# Patient Record
Sex: Female | Born: 1955 | Race: White | Hispanic: No | Marital: Married | Smoking: Never smoker
Health system: Southern US, Community
[De-identification: ages and names within clinical notes are randomized; demographics above are authoritative.]

## PROBLEM LIST (undated history)

## (undated) DIAGNOSIS — F329 Major depressive disorder, single episode, unspecified: Secondary | ICD-10-CM

## (undated) DIAGNOSIS — F32A Depression, unspecified: Secondary | ICD-10-CM

## (undated) DIAGNOSIS — M2550 Pain in unspecified joint: Secondary | ICD-10-CM

## (undated) DIAGNOSIS — I10 Essential (primary) hypertension: Secondary | ICD-10-CM

## (undated) DIAGNOSIS — M549 Dorsalgia, unspecified: Secondary | ICD-10-CM

## (undated) DIAGNOSIS — F410 Panic disorder [episodic paroxysmal anxiety] without agoraphobia: Secondary | ICD-10-CM

## (undated) DIAGNOSIS — E039 Hypothyroidism, unspecified: Secondary | ICD-10-CM

## (undated) HISTORY — DX: Dorsalgia, unspecified: M54.9

## (undated) HISTORY — PX: CHOLECYSTECTOMY: SHX55

## (undated) HISTORY — PX: BREAST CYST EXCISION: SHX579

## (undated) HISTORY — DX: Hypothyroidism, unspecified: E03.9

## (undated) HISTORY — PX: OTHER SURGICAL HISTORY: SHX169

## (undated) HISTORY — DX: Pain in unspecified joint: M25.50

---
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
## 1983-02-13 HISTORY — PX: BREAST BIOPSY: SHX20

## 2000-02-13 HISTORY — PX: BREAST BIOPSY: SHX20

## 2000-02-13 HISTORY — PX: BREAST EXCISIONAL BIOPSY: SUR124

## 2000-03-08 ENCOUNTER — Encounter: Payer: Self-pay | Admitting: Family Medicine

## 2000-03-08 ENCOUNTER — Encounter: Admission: RE | Admit: 2000-03-08 | Discharge: 2000-03-08 | Payer: Self-pay | Admitting: Family Medicine

## 2001-04-12 ENCOUNTER — Emergency Department (HOSPITAL_COMMUNITY): Admission: EM | Admit: 2001-04-12 | Discharge: 2001-04-12 | Payer: Self-pay | Admitting: Emergency Medicine

## 2001-04-23 ENCOUNTER — Encounter: Payer: Self-pay | Admitting: Family Medicine

## 2001-04-23 ENCOUNTER — Encounter: Admission: RE | Admit: 2001-04-23 | Discharge: 2001-04-23 | Payer: Self-pay | Admitting: Family Medicine

## 2001-05-12 ENCOUNTER — Encounter: Admission: RE | Admit: 2001-05-12 | Discharge: 2001-05-12 | Payer: Self-pay | Admitting: Family Medicine

## 2001-05-12 ENCOUNTER — Encounter: Payer: Self-pay | Admitting: Family Medicine

## 2001-05-23 ENCOUNTER — Encounter: Payer: Self-pay | Admitting: General Surgery

## 2001-05-23 ENCOUNTER — Observation Stay (HOSPITAL_COMMUNITY): Admission: RE | Admit: 2001-05-23 | Discharge: 2001-05-24 | Payer: Self-pay | Admitting: General Surgery

## 2001-05-23 ENCOUNTER — Encounter (INDEPENDENT_AMBULATORY_CARE_PROVIDER_SITE_OTHER): Payer: Self-pay | Admitting: Specialist

## 2002-04-27 ENCOUNTER — Encounter: Admission: RE | Admit: 2002-04-27 | Discharge: 2002-04-27 | Payer: Self-pay | Admitting: Family Medicine

## 2002-04-27 ENCOUNTER — Encounter: Payer: Self-pay | Admitting: Family Medicine

## 2003-02-24 ENCOUNTER — Encounter: Admission: RE | Admit: 2003-02-24 | Discharge: 2003-02-24 | Payer: Self-pay | Admitting: Family Medicine

## 2003-09-29 ENCOUNTER — Encounter: Admission: RE | Admit: 2003-09-29 | Discharge: 2003-09-29 | Payer: Self-pay | Admitting: Family Medicine

## 2004-01-10 ENCOUNTER — Other Ambulatory Visit: Admission: RE | Admit: 2004-01-10 | Discharge: 2004-01-10 | Payer: Self-pay | Admitting: Gynecology

## 2004-07-08 ENCOUNTER — Emergency Department (HOSPITAL_COMMUNITY): Admission: EM | Admit: 2004-07-08 | Discharge: 2004-07-08 | Payer: Self-pay | Admitting: Emergency Medicine

## 2004-07-10 ENCOUNTER — Emergency Department (HOSPITAL_COMMUNITY): Admission: EM | Admit: 2004-07-10 | Discharge: 2004-07-11 | Payer: Self-pay | Admitting: Emergency Medicine

## 2004-08-11 ENCOUNTER — Encounter: Admission: RE | Admit: 2004-08-11 | Discharge: 2004-08-11 | Payer: Self-pay | Admitting: Family Medicine

## 2004-08-22 ENCOUNTER — Encounter: Admission: RE | Admit: 2004-08-22 | Discharge: 2004-08-22 | Payer: Self-pay | Admitting: Family Medicine

## 2004-11-08 ENCOUNTER — Encounter: Admission: RE | Admit: 2004-11-08 | Discharge: 2004-11-08 | Payer: Self-pay | Admitting: Family Medicine

## 2005-03-27 ENCOUNTER — Ambulatory Visit: Payer: Self-pay | Admitting: Gastroenterology

## 2005-06-13 ENCOUNTER — Emergency Department (HOSPITAL_COMMUNITY): Admission: EM | Admit: 2005-06-13 | Discharge: 2005-06-13 | Payer: Self-pay | Admitting: Emergency Medicine

## 2005-12-06 ENCOUNTER — Encounter: Admission: RE | Admit: 2005-12-06 | Discharge: 2005-12-06 | Payer: Self-pay | Admitting: Family Medicine

## 2007-01-29 ENCOUNTER — Encounter: Admission: RE | Admit: 2007-01-29 | Discharge: 2007-01-29 | Payer: Self-pay | Admitting: Family Medicine

## 2007-02-11 ENCOUNTER — Encounter: Admission: RE | Admit: 2007-02-11 | Discharge: 2007-02-11 | Payer: Self-pay | Admitting: Family Medicine

## 2007-03-19 ENCOUNTER — Encounter: Admission: RE | Admit: 2007-03-19 | Discharge: 2007-03-19 | Payer: Self-pay | Admitting: Family Medicine

## 2008-01-30 ENCOUNTER — Encounter: Admission: RE | Admit: 2008-01-30 | Discharge: 2008-01-30 | Payer: Self-pay | Admitting: Family Medicine

## 2009-03-14 ENCOUNTER — Encounter: Admission: RE | Admit: 2009-03-14 | Discharge: 2009-03-14 | Payer: Self-pay | Admitting: Family Medicine

## 2010-03-05 ENCOUNTER — Encounter: Payer: Self-pay | Admitting: Family Medicine

## 2010-05-05 ENCOUNTER — Other Ambulatory Visit: Payer: Self-pay | Admitting: Family Medicine

## 2010-05-05 DIAGNOSIS — Z1231 Encounter for screening mammogram for malignant neoplasm of breast: Secondary | ICD-10-CM

## 2010-05-09 ENCOUNTER — Ambulatory Visit
Admission: RE | Admit: 2010-05-09 | Discharge: 2010-05-09 | Disposition: A | Discharge status: Home / Self Care | Payer: Self-pay | Source: Ambulatory Visit | Attending: Family Medicine | Admitting: Family Medicine

## 2010-05-09 DIAGNOSIS — Z1231 Encounter for screening mammogram for malignant neoplasm of breast: Secondary | ICD-10-CM

## 2010-06-30 NOTE — Op Note (Signed)
[facility]  Patient:    KURSTIN, DIMARZO Visit Number: [id number] MRN: [id number]          Service Type: DSU Location: DAY Attending Physician:  Carson Myrtle Dictated by:   Sheppard Plumber Earlene Plater, M.D. Proc. Date: 05/23/01 Admit Date:  05/23/2001   CC:         Tammy R. Collins [place], M.D.   Operative Report  PREOPERATIVE DIAGNOSIS:  Cholecystolithiasis.  POSTOPERATIVE DIAGNOSIS:  Cholecystolithiasis.  PROCEDURE:  Laparoscopic cholecystectomy with operative cholangiogram.  SURGEON:  Timothy E. Earlene Plater, M.D.  ASSISTANT:  Zigmund Daniel, M.D.  ANESTHESIA:  General.  CRNA supervised (Dr.Fortune).  INDICATIONS:  Ms. Carp is 42 and has had significant symptoms compatible with cholecystolithiasis.  Ultrasound confirmed stones.  Liver function studies showed elevation of SGOT and SGPT.  On a very limited diet she has been asymptomatic for the past seven days.  She is ready now to proceed with this urgently scheduled laparoscopic cholecystectomy.  DESCRIPTION OF PROCEDURE:  The patient was brought to the operating room and placed supine.  General endotracheal anesthesia was administered and the abdomen was prepped and draped in sterile fashion.  A vertical midline incision was made beneath the umbilicus and the fascia identified.  Scar tissue and old Tycron sutures identified.  The sutures were removed.  The fascia divided in the midline.  The peritoneum entered without complication.  A #1 Vicryl suture was placed across that wound, and a Hasson catheter passed between the suture and tied in place.  The abdomen was insufflated. Pelvic peritoneoscopy revealed cystic areas on the scarred right ovary, but otherwise normal.  Attention to the upper abdomen revealed a chronic and inflamed gallbladder.  Two 5 mm trocars were placed in the right upper quadrant, and a 10 mm trocar was placed in the mid epigastrium.  The gallbladder was grasped, placed on  tension, careful dissection at the base of the gallbladder revealed a normal-appearing cystic duct entering the gallbladder.  This was carefully dissected at the junction with the gallbladder.  A clip was placed on the gallbladder portion of the cystic duct.  A small incision was made in the cystic duct, and several small stones were removed. The Reddick catheter introduced, clamped and using real-time fluoroscopy dilute HiPaque flow freely through the cystic duct into the common duct system -- which filled rapidly and normally emptied into the duodenum. The clip was removed and the catheter removed.  The remnant of the cystic duct isolated, triply clipped, and the cystic duct divided.  Behind that was a normal appearing cystic duct artery, emanating from an hepatic artery.  The cystic duct portion only was quadruply clipped and divided.  The gallbladder was removed from the gallbladder bed, and with some difficulty because it was more intrahepatic than not.  Small rent made in the gallbladder, the bile and several small stones were suctioned.  The gallbladder was completely removed from the gallbladder bed without further difficulty.  It was put in and Endocatch bag.  The gallbladder bed was inspected and cauterized.  All the bile and several small stones were irrigated and suctioned away.  The gallbladder was removed through the infraumbilical incision, which was then tied snugly.  Further irrigation was clear.  No evidence of loose stones. With this, the procedure was complete.  All irrigants, CO2, instruments and trocars were removed.  The skin incision was closed with 3-0 Monocryl. Steri-Strips applied.  Dry sterile dressings.  Count was correct.  She tolerated it  well and was removed from the recovery room in good condition. Dictated by:   Sheppard Plumber Earlene Plater, M.D. Attending Physician:  Carson Myrtle DD:  05/23/01 TD:  05/24/01 Job: [phone]

## 2010-10-04 ENCOUNTER — Encounter (HOSPITAL_COMMUNITY): Payer: Self-pay | Admitting: *Deleted

## 2010-10-04 ENCOUNTER — Emergency Department (HOSPITAL_COMMUNITY): Payer: 59

## 2010-10-04 ENCOUNTER — Emergency Department (HOSPITAL_COMMUNITY)
Admission: EM | Admit: 2010-10-04 | Discharge: 2010-10-04 | Disposition: A | Discharge status: Home / Self Care | Payer: 59 | Attending: Emergency Medicine | Admitting: Emergency Medicine

## 2010-10-04 ENCOUNTER — Other Ambulatory Visit: Payer: Self-pay

## 2010-10-04 DIAGNOSIS — R42 Dizziness and giddiness: Secondary | ICD-10-CM | POA: Insufficient documentation

## 2010-10-04 DIAGNOSIS — F3289 Other specified depressive episodes: Secondary | ICD-10-CM | POA: Insufficient documentation

## 2010-10-04 DIAGNOSIS — I10 Essential (primary) hypertension: Secondary | ICD-10-CM | POA: Insufficient documentation

## 2010-10-04 DIAGNOSIS — R5381 Other malaise: Secondary | ICD-10-CM | POA: Insufficient documentation

## 2010-10-04 DIAGNOSIS — F329 Major depressive disorder, single episode, unspecified: Secondary | ICD-10-CM | POA: Insufficient documentation

## 2010-10-04 DIAGNOSIS — R5383 Other fatigue: Secondary | ICD-10-CM | POA: Insufficient documentation

## 2010-10-04 DIAGNOSIS — F410 Panic disorder [episodic paroxysmal anxiety] without agoraphobia: Secondary | ICD-10-CM

## 2010-10-04 HISTORY — DX: Panic disorder (episodic paroxysmal anxiety): F41.0

## 2010-10-04 HISTORY — DX: Major depressive disorder, single episode, unspecified: F32.9

## 2010-10-04 HISTORY — DX: Essential (primary) hypertension: I10

## 2010-10-04 HISTORY — DX: Depression, unspecified: F32.A

## 2010-10-04 LAB — CBC
MCH: 30.4 pg (ref 26.0–34.0)
MCHC: 34 g/dL (ref 30.0–36.0)
MCV: 89.4 fL (ref 78.0–100.0)
Platelets: 355 10*3/uL (ref 150–400)
RDW: 12.4 % (ref 11.5–15.5)

## 2010-10-04 LAB — URINALYSIS, ROUTINE W REFLEX MICROSCOPIC
Glucose, UA: NEGATIVE mg/dL
Ketones, ur: NEGATIVE mg/dL
Leukocytes, UA: NEGATIVE
Protein, ur: NEGATIVE mg/dL
Urobilinogen, UA: 0.2 mg/dL (ref 0.0–1.0)

## 2010-10-04 LAB — BASIC METABOLIC PANEL
Calcium: 10.1 mg/dL (ref 8.4–10.5)
Creatinine, Ser: 0.76 mg/dL (ref 0.50–1.10)
GFR calc non Af Amer: >60 mL/min (ref >60)
Sodium: 138 mEq/L (ref 135–145)

## 2010-10-04 NOTE — ED Notes (Signed)
Pt with hx of HTN and panic attacks; states approx 1230 today, while sitting at desk at work, felt like she was having a panic attack and felt "like my blood pressure shot straight to my head"; reports she took one and a half 0.25 mg Xanax (0.75mg ) at that time; also c/o feeling weak at onset, but that is better now; states feels drowsy from the Xanax. Denies pain; Alert, anxious, answers questions appropriately.  Placed on NIBP monitoring q 30 min.

## 2010-10-04 NOTE — ED Provider Notes (Signed)
History     CSN: [id number] Arrival date & time: 10/04/2010  1:38 PM  Chief Complaint  Patient presents with  . Hypertension   Patient is a 55 y.o. female presenting with hypertension. The history is provided by the patient.  Hypertension This is a recurrent problem. The current episode started today. The problem occurs intermittently. The problem has been gradually improving. Associated symptoms include fatigue and vertigo. Pertinent negatives include no abdominal pain, arthralgias, change in bowel habit, chest pain, coughing or neck pain. Associated symptoms comments: anxious. The symptoms are aggravated by nothing. She has tried walking (xanax) for the symptoms. The treatment provided mild relief.    Past Medical History  Diagnosis Date  . Hypertension   . Panic attacks   . Depression     Past Surgical History  Procedure Date  . Cesarean section   . Cholecystectomy     History reviewed. No pertinent family history.  History  Substance Use Topics  . Smoking status: Never Smoker   . Smokeless tobacco: Not on file  . Alcohol Use: No    OB History    Grav Para Term Preterm Abortions TAB SAB Ect Mult Living                  Review of Systems  Constitutional: Positive for fatigue. Negative for activity change.       All ROS Neg except as noted in HPI  HENT: Negative for nosebleeds and neck pain.   Eyes: Negative for photophobia and discharge.  Respiratory: Negative for cough, shortness of breath and wheezing.   Cardiovascular: Negative for chest pain and palpitations.  Gastrointestinal: Negative for abdominal pain, blood in stool and change in bowel habit.  Genitourinary: Negative for dysuria, frequency and hematuria.  Musculoskeletal: Negative for back pain and arthralgias.  Skin: Negative.   Neurological: Positive for vertigo. Negative for dizziness, seizures and speech difficulty.  Psychiatric/Behavioral: Negative for hallucinations and confusion. The patient is  nervous/anxious.     Physical Exam  BP 148/76  Pulse 86  Temp(Src) 98.3 F (36.8 C) (Oral)  Resp 16  Ht 5\' 1"  (1.549 m)  Wt 146 lb (66.225 kg)  BMI 27.59 kg/m2  SpO2 96%  Physical Exam  Nursing note and vitals reviewed. Constitutional: She is oriented to person, place, and time. She appears well-developed and well-nourished.  Non-toxic appearance.  HENT:  Head: Normocephalic.  Right Ear: Tympanic membrane and external ear normal.  Left Ear: Tympanic membrane and external ear normal.  Eyes: EOM and lids are normal. Pupils are equal, round, and reactive to light.  Neck: Normal range of motion. Neck supple. Carotid bruit is not present.  Cardiovascular: Normal rate, regular rhythm, normal heart sounds, intact distal pulses and normal pulses.   Pulmonary/Chest: Breath sounds normal. No respiratory distress.  Abdominal: Soft. Bowel sounds are normal. There is no tenderness. There is no guarding.  Musculoskeletal: Normal range of motion.  Lymphadenopathy:       Head (right side): No submandibular adenopathy present.       Head (left side): No submandibular adenopathy present.    She has no cervical adenopathy.  Neurological: She is alert and oriented to person, place, and time. She has normal strength. No cranial nerve deficit or sensory deficit. Coordination normal. GCS eye subscore is 4. GCS verbal subscore is 5. GCS motor subscore is 6.       Grip symetrical. No pronator drift. Speech clear.  Skin: Skin is warm and dry.  Psychiatric:  She has a normal mood and affect. Her speech is normal.    ED Course  Procedures  MDM I have reviewed nursing notes, vital signs, and all appropriate lab and imaging results for this patient. No gross neurologic changes. Will check chemistries and ekg. Pt getting better after xanax.      Kathie Dike, [place] 10/04/10 [phone]

## 2010-10-04 NOTE — ED Notes (Signed)
Pt sitting at office and thought she was having a panic attack. "I felt my blood pressure go up". Took 0.25mg  xanax which states usually  Makes her feel better but did not this time. Nad. Also took 2 baby aspirins. Denies cp/sob/dizziness. nad at this time.

## 2010-10-05 NOTE — ED Provider Notes (Signed)
Medical screening examination/treatment/procedure(s) were performed by non-physician practitioner and as supervising physician I was immediately available for consultation/collaboration.   Benny Lennert, MD 10/05/10 [phone]

## 2011-01-26 ENCOUNTER — Other Ambulatory Visit (HOSPITAL_COMMUNITY): Payer: Self-pay | Admitting: Family Medicine

## 2011-01-26 DIAGNOSIS — R102 Pelvic and perineal pain: Secondary | ICD-10-CM

## 2011-01-30 ENCOUNTER — Ambulatory Visit (HOSPITAL_COMMUNITY)
Admission: RE | Admit: 2011-01-30 | Discharge: 2011-01-30 | Disposition: A | Discharge status: Home / Self Care | Payer: PRIVATE HEALTH INSURANCE | Source: Ambulatory Visit | Attending: Family Medicine | Admitting: Family Medicine

## 2011-01-30 DIAGNOSIS — R102 Pelvic and perineal pain: Secondary | ICD-10-CM

## 2011-01-30 DIAGNOSIS — I10 Essential (primary) hypertension: Secondary | ICD-10-CM | POA: Insufficient documentation

## 2011-01-30 DIAGNOSIS — R109 Unspecified abdominal pain: Secondary | ICD-10-CM | POA: Insufficient documentation

## 2011-06-27 ENCOUNTER — Other Ambulatory Visit: Payer: Self-pay | Admitting: Family Medicine

## 2011-06-27 DIAGNOSIS — Z1231 Encounter for screening mammogram for malignant neoplasm of breast: Secondary | ICD-10-CM

## 2011-07-13 ENCOUNTER — Ambulatory Visit
Admission: RE | Admit: 2011-07-13 | Discharge: 2011-07-13 | Disposition: A | Discharge status: Home / Self Care | Payer: PRIVATE HEALTH INSURANCE | Source: Ambulatory Visit | Attending: Family Medicine | Admitting: Family Medicine

## 2011-07-13 DIAGNOSIS — Z1231 Encounter for screening mammogram for malignant neoplasm of breast: Secondary | ICD-10-CM

## 2011-08-28 ENCOUNTER — Emergency Department (HOSPITAL_COMMUNITY): Payer: PRIVATE HEALTH INSURANCE

## 2011-08-28 ENCOUNTER — Encounter (HOSPITAL_COMMUNITY): Payer: Self-pay | Admitting: Emergency Medicine

## 2011-08-28 ENCOUNTER — Emergency Department (HOSPITAL_COMMUNITY)
Admission: EM | Admit: 2011-08-28 | Discharge: 2011-08-28 | Disposition: A | Discharge status: Home / Self Care | Payer: PRIVATE HEALTH INSURANCE | Attending: Emergency Medicine | Admitting: Emergency Medicine

## 2011-08-28 DIAGNOSIS — I10 Essential (primary) hypertension: Secondary | ICD-10-CM | POA: Insufficient documentation

## 2011-08-28 DIAGNOSIS — Z79899 Other long term (current) drug therapy: Secondary | ICD-10-CM | POA: Insufficient documentation

## 2011-08-28 DIAGNOSIS — R5381 Other malaise: Secondary | ICD-10-CM | POA: Insufficient documentation

## 2011-08-28 DIAGNOSIS — F411 Generalized anxiety disorder: Secondary | ICD-10-CM | POA: Insufficient documentation

## 2011-08-28 DIAGNOSIS — F419 Anxiety disorder, unspecified: Secondary | ICD-10-CM

## 2011-08-28 LAB — CBC WITH DIFFERENTIAL/PLATELET
Basophils Absolute: 0 10*3/uL (ref 0.0–0.1)
Eosinophils Relative: 2 % (ref 0–5)
HCT: 40.7 % (ref 36.0–46.0)
Hemoglobin: 14.2 g/dL (ref 12.0–15.0)
Lymphocytes Relative: 42 % (ref 12–46)
MCV: 87.9 fL (ref 78.0–100.0)
Monocytes Absolute: 0.8 10*3/uL (ref 0.1–1.0)
Monocytes Relative: 11 % (ref 3–12)
Neutro Abs: 3.3 10*3/uL (ref 1.7–7.7)
RDW: 12.1 % (ref 11.5–15.5)
WBC: 7.3 10*3/uL (ref 4.0–10.5)

## 2011-08-28 LAB — POCT I-STAT, CHEM 8
BUN: 13 mg/dL (ref 6–23)
Calcium, Ion: 1.18 mmol/L (ref 1.12–1.23)
Chloride: 105 mEq/L (ref 96–112)
Creatinine, Ser: 0.8 mg/dL (ref 0.50–1.10)
Glucose, Bld: 103 mg/dL — ABNORMAL HIGH (ref 70–99)
HCT: 44 % (ref 36.0–46.0)
Potassium: 4.2 mEq/L (ref 3.5–5.1)

## 2011-08-28 LAB — URINALYSIS, ROUTINE W REFLEX MICROSCOPIC
Bilirubin Urine: NEGATIVE
Glucose, UA: NEGATIVE mg/dL
Ketones, ur: NEGATIVE mg/dL
Specific Gravity, Urine: 1.008 (ref 1.005–1.030)
pH: 6.5 (ref 5.0–8.0)

## 2011-08-28 LAB — URINE MICROSCOPIC-ADD ON

## 2011-08-28 LAB — POCT I-STAT TROPONIN I: Troponin i, poc: 0 ng/mL (ref 0.00–0.08)

## 2011-08-28 NOTE — ED Provider Notes (Signed)
History     CSN: [id number]  Arrival date & time 08/28/11  0151   First MD Initiated Contact with Patient 08/28/11 0407      Chief Complaint  Patient presents with  . Hypertension    (Consider location/radiation/quality/duration/timing/severity/associated sxs/prior treatment) Patient is a 56 y.o. female presenting with hypertension. The history is provided by the patient and the spouse. No language interpreter was used.  Hypertension This is a chronic problem. The current episode started more than 1 week ago. The problem occurs constantly. The problem has been gradually worsening. Pertinent negatives include no chest pain, no abdominal pain, no headaches and no shortness of breath. Nothing aggravates the symptoms. Nothing relieves the symptoms. She has tried nothing for the symptoms. The treatment provided significant relief.  Became anxious, and came in because her aniety was making her BP worse.  Is transitioning off wellbutrin and BP meds are currently being adjusted  Past Medical History  Diagnosis Date  . Hypertension   . Panic attacks   . Depression     Past Surgical History  Procedure Date  . Cesarean section   . Cholecystectomy   . Lumpectomy left breast x 2      Family History  Problem Relation Age of Onset  . Stroke Mother   . Heart attack Mother   . Stroke Father     History  Substance Use Topics  . Smoking status: Never Smoker   . Smokeless tobacco: Not on file  . Alcohol Use: No    OB History    Grav Para Term Preterm Abortions TAB SAB Ect Mult Living                  Review of Systems  Respiratory: Negative for shortness of breath.   Cardiovascular: Negative for chest pain.  Gastrointestinal: Negative for abdominal pain.  Neurological: Negative for headaches.  Psychiatric/Behavioral: The patient is nervous/anxious.   All other systems reviewed and are negative.    Allergies  Review of patient's allergies indicates no known  allergies.  Home Medications   Current Outpatient Rx  Name Route Sig Dispense Refill  . ALPRAZOLAM 0.25 MG PO TABS Oral Take 0.25 mg by mouth at bedtime as needed. For anxiety     . ASPIRIN 81 MG PO TABS Oral Take 81 mg by mouth once.    . BUPROPION HCL ER (XL) 150 MG PO TB24 Oral Take 150 mg by mouth daily.    . OMEGA-3 FATTY ACIDS 1000 MG PO CAPS Oral Take 2 g by mouth daily.      . ADULT MULTIVITAMIN W/MINERALS CH Oral Take 1 tablet by mouth daily.    Marland Kitchen VALSARTAN 160 MG PO TABS Oral Take 160 mg by mouth daily.      BP 156/75  Pulse 84  Temp 97.6 F (36.4 C) (Oral)  Resp 16  SpO2 98%  Physical Exam  Constitutional: She is oriented to person, place, and time. She appears well-developed and well-nourished.  HENT:  Head: Normocephalic and atraumatic.  Mouth/Throat: Oropharynx is clear and moist.  Eyes: Conjunctivae and EOM are normal. Pupils are equal, round, and reactive to light.  Neck: Normal range of motion. Neck supple.  Cardiovascular: Normal rate and regular rhythm.   Pulmonary/Chest: Effort normal and breath sounds normal. She has no wheezes. She has no rales.  Abdominal: Soft. Bowel sounds are normal. There is no tenderness. There is no rebound and no guarding.  Musculoskeletal: Normal range of motion. She exhibits no  edema.  Neurological: She is alert and oriented to person, place, and time. She displays normal reflexes. No cranial nerve deficit.  Skin: Skin is warm and dry.  Psychiatric: She has a normal mood and affect.    ED Course  Procedures (including critical care time)  Labs Reviewed  URINALYSIS, ROUTINE W REFLEX MICROSCOPIC - Abnormal; Notable for the following:    Hgb urine dipstick TRACE (*)     All other components within normal limits  POCT I-STAT, CHEM 8 - Abnormal; Notable for the following:    Glucose, Bld 103 (*)     All other components within normal limits  URINE MICROSCOPIC-ADD ON - Abnormal; Notable for the following:    Squamous  Epithelial / LPF FEW (*)     All other components within normal limits  CBC WITH DIFFERENTIAL  POCT I-STAT TROPONIN I   Dg Chest 2 View  08/28/2011  *RADIOLOGY REPORT*  Clinical Data: Sunrise and blood pressure.  Neck pain.  Leg weakness.  CHEST - 2 VIEW  Comparison: None.  Findings: Shallow inspiration.  Normal heart size and pulmonary vascularity.  No focal airspace consolidation in the lungs.  No blunting of costophrenic angles.  No pneumothorax.  Surgical clips in the right upper quadrant.  IMPRESSION: No evidence of active pulmonary disease.  Original Report Authenticated By: Marlon Pel, M.D.     No diagnosis found.    MDM   Date: 08/28/2011  Rate: 89  Rhythm: normal sinus rhythm  QRS Axis: normal  Intervals: normal  ST/T Wave abnormalities: normal  Conduction Disutrbances: none  Narrative Interpretation: unremarkable    Anxiety related to tapering off wellbutrin and BP elevated more than usual secondary to anxiety of BP.  At length discussion with patient and family about keeping BP journal and always take BP when you have been seated and at rest for 10 minutes and having BP monitor calibrated.  Return for weakness numbness CP SOB or any concerns. Follow up with Dr. Yehuda Budd today.  Patient and husband verbalize understanding and agree to follow up       Jalin Alicea Smitty Cords, MD 08/28/11 [phone]

## 2011-08-28 NOTE — ED Notes (Signed)
Pt states she was at home and felt shaky all over, neck was hurting, and felt weak in her knees  Pt states she checked her pressure at home twice and it was elevated   Pt states she takes blood pressure medication at home   Pt states she continues to feel weak all over   Pt states she is also being weaned off her wellbutrin  Pt states this has been going on for 7 days now  Pt states she is having nausea and dry mouth

## 2012-08-25 ENCOUNTER — Ambulatory Visit
Admission: RE | Admit: 2012-08-25 | Discharge: 2012-08-25 | Disposition: A | Discharge status: Home / Self Care | Payer: PRIVATE HEALTH INSURANCE | Source: Ambulatory Visit

## 2012-08-25 ENCOUNTER — Other Ambulatory Visit: Payer: Self-pay

## 2012-08-25 ENCOUNTER — Ambulatory Visit: Payer: PRIVATE HEALTH INSURANCE

## 2012-08-25 DIAGNOSIS — Z1231 Encounter for screening mammogram for malignant neoplasm of breast: Secondary | ICD-10-CM

## 2012-08-27 ENCOUNTER — Other Ambulatory Visit: Payer: Self-pay | Admitting: Family Medicine

## 2012-08-27 DIAGNOSIS — R928 Other abnormal and inconclusive findings on diagnostic imaging of breast: Secondary | ICD-10-CM

## 2012-08-29 ENCOUNTER — Ambulatory Visit
Admission: RE | Admit: 2012-08-29 | Discharge: 2012-08-29 | Disposition: A | Discharge status: Home / Self Care | Payer: PRIVATE HEALTH INSURANCE | Source: Ambulatory Visit | Attending: Family Medicine | Admitting: Family Medicine

## 2012-08-29 DIAGNOSIS — R928 Other abnormal and inconclusive findings on diagnostic imaging of breast: Secondary | ICD-10-CM

## 2013-02-10 ENCOUNTER — Other Ambulatory Visit: Payer: Self-pay | Admitting: Family Medicine

## 2013-02-10 DIAGNOSIS — N6320 Unspecified lump in the left breast, unspecified quadrant: Secondary | ICD-10-CM

## 2013-02-17 ENCOUNTER — Ambulatory Visit
Admission: RE | Admit: 2013-02-17 | Discharge: 2013-02-17 | Disposition: A | Discharge status: Home / Self Care | Payer: Self-pay | Source: Ambulatory Visit | Attending: Family Medicine | Admitting: Family Medicine

## 2013-02-17 DIAGNOSIS — N6320 Unspecified lump in the left breast, unspecified quadrant: Secondary | ICD-10-CM

## 2013-08-13 ENCOUNTER — Other Ambulatory Visit: Payer: Self-pay | Admitting: Family Medicine

## 2013-08-13 ENCOUNTER — Other Ambulatory Visit (HOSPITAL_COMMUNITY)
Admission: RE | Admit: 2013-08-13 | Discharge: 2013-08-13 | Disposition: A | Discharge status: Home / Self Care | Payer: 59 | Source: Ambulatory Visit | Attending: Family Medicine | Admitting: Family Medicine

## 2013-08-13 DIAGNOSIS — Z01419 Encounter for gynecological examination (general) (routine) without abnormal findings: Secondary | ICD-10-CM | POA: Insufficient documentation

## 2013-08-13 DIAGNOSIS — R87810 Cervical high risk human papillomavirus (HPV) DNA test positive: Secondary | ICD-10-CM | POA: Insufficient documentation

## 2013-08-13 DIAGNOSIS — Z1151 Encounter for screening for human papillomavirus (HPV): Secondary | ICD-10-CM | POA: Insufficient documentation

## 2013-08-18 LAB — CYTOLOGY - PAP

## 2014-02-16 ENCOUNTER — Other Ambulatory Visit: Payer: Self-pay

## 2014-02-16 DIAGNOSIS — Z1231 Encounter for screening mammogram for malignant neoplasm of breast: Secondary | ICD-10-CM

## 2014-02-18 ENCOUNTER — Other Ambulatory Visit: Payer: Self-pay

## 2014-02-18 ENCOUNTER — Ambulatory Visit
Admission: RE | Admit: 2014-02-18 | Discharge: 2014-02-18 | Disposition: A | Discharge status: Home / Self Care | Payer: 59 | Source: Ambulatory Visit

## 2014-02-18 DIAGNOSIS — Z1231 Encounter for screening mammogram for malignant neoplasm of breast: Secondary | ICD-10-CM

## 2015-04-14 ENCOUNTER — Other Ambulatory Visit: Payer: Self-pay | Admitting: Family Medicine

## 2015-04-14 ENCOUNTER — Other Ambulatory Visit (HOSPITAL_COMMUNITY)
Admission: RE | Admit: 2015-04-14 | Discharge: 2015-04-14 | Disposition: A | Discharge status: Home / Self Care | Payer: 59 | Source: Ambulatory Visit | Attending: Family Medicine | Admitting: Family Medicine

## 2015-04-14 DIAGNOSIS — Z1151 Encounter for screening for human papillomavirus (HPV): Secondary | ICD-10-CM | POA: Insufficient documentation

## 2015-04-14 DIAGNOSIS — Z124 Encounter for screening for malignant neoplasm of cervix: Secondary | ICD-10-CM | POA: Insufficient documentation

## 2015-04-15 LAB — CYTOLOGY - PAP

## 2015-06-29 ENCOUNTER — Other Ambulatory Visit: Payer: Self-pay

## 2015-06-29 DIAGNOSIS — Z1231 Encounter for screening mammogram for malignant neoplasm of breast: Secondary | ICD-10-CM

## 2015-07-18 ENCOUNTER — Ambulatory Visit
Admission: RE | Admit: 2015-07-18 | Discharge: 2015-07-18 | Disposition: A | Discharge status: Home / Self Care | Payer: 59 | Source: Ambulatory Visit

## 2015-07-18 DIAGNOSIS — Z1231 Encounter for screening mammogram for malignant neoplasm of breast: Secondary | ICD-10-CM

## 2015-07-20 ENCOUNTER — Other Ambulatory Visit: Payer: Self-pay | Admitting: Family Medicine

## 2015-07-20 DIAGNOSIS — R928 Other abnormal and inconclusive findings on diagnostic imaging of breast: Secondary | ICD-10-CM

## 2015-07-29 ENCOUNTER — Ambulatory Visit
Admission: RE | Admit: 2015-07-29 | Discharge: 2015-07-29 | Disposition: A | Discharge status: Home / Self Care | Payer: 59 | Source: Ambulatory Visit | Attending: Family Medicine | Admitting: Family Medicine

## 2015-07-29 DIAGNOSIS — R928 Other abnormal and inconclusive findings on diagnostic imaging of breast: Secondary | ICD-10-CM

## 2016-03-08 ENCOUNTER — Other Ambulatory Visit: Payer: Self-pay | Admitting: Family Medicine

## 2016-03-08 DIAGNOSIS — N63 Unspecified lump in unspecified breast: Secondary | ICD-10-CM

## 2016-03-21 ENCOUNTER — Ambulatory Visit
Admission: RE | Admit: 2016-03-21 | Discharge: 2016-03-21 | Disposition: A | Discharge status: Home / Self Care | Payer: 59 | Source: Ambulatory Visit | Attending: Family Medicine | Admitting: Family Medicine

## 2016-03-21 ENCOUNTER — Other Ambulatory Visit: Payer: Self-pay | Admitting: Family Medicine

## 2016-03-21 DIAGNOSIS — N63 Unspecified lump in unspecified breast: Secondary | ICD-10-CM

## 2016-08-27 ENCOUNTER — Other Ambulatory Visit: Payer: Self-pay | Admitting: Family Medicine

## 2016-08-27 DIAGNOSIS — Z1231 Encounter for screening mammogram for malignant neoplasm of breast: Secondary | ICD-10-CM

## 2016-09-06 ENCOUNTER — Ambulatory Visit
Admission: RE | Admit: 2016-09-06 | Discharge: 2016-09-06 | Disposition: A | Discharge status: Home / Self Care | Payer: 59 | Source: Ambulatory Visit | Attending: Family Medicine | Admitting: Family Medicine

## 2016-09-06 DIAGNOSIS — Z1231 Encounter for screening mammogram for malignant neoplasm of breast: Secondary | ICD-10-CM

## 2018-05-25 IMAGING — MG 2D DIGITAL SCREENING BILATERAL MAMMOGRAM WITH CAD AND ADJUNCT TO
8 of 12 series · 8 of 28 positions shown · non-contrast
Comparison: Previous exam(s).

CLINICAL DATA: Screening.

EXAM:
2D DIGITAL SCREENING BILATERAL MAMMOGRAM WITH CAD AND ADJUNCT TOMO

[R CC synth-2D]
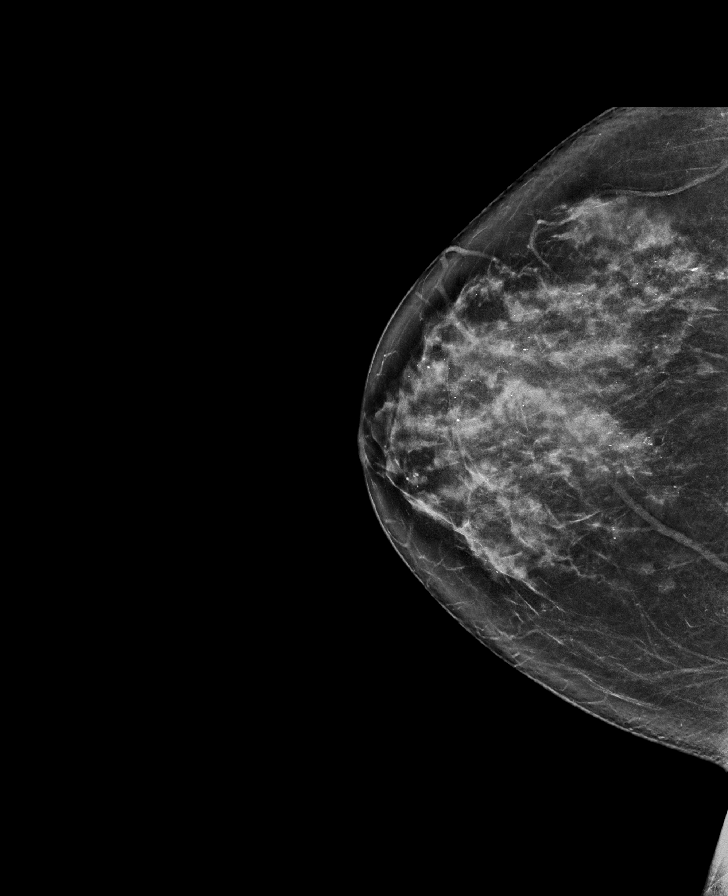

[R MLO]
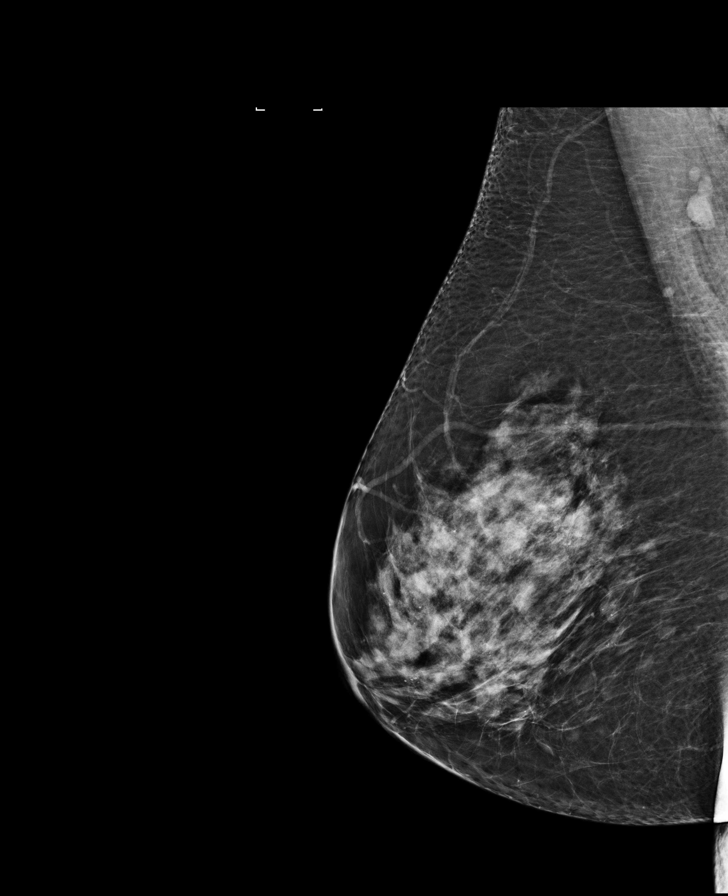

[L CC]
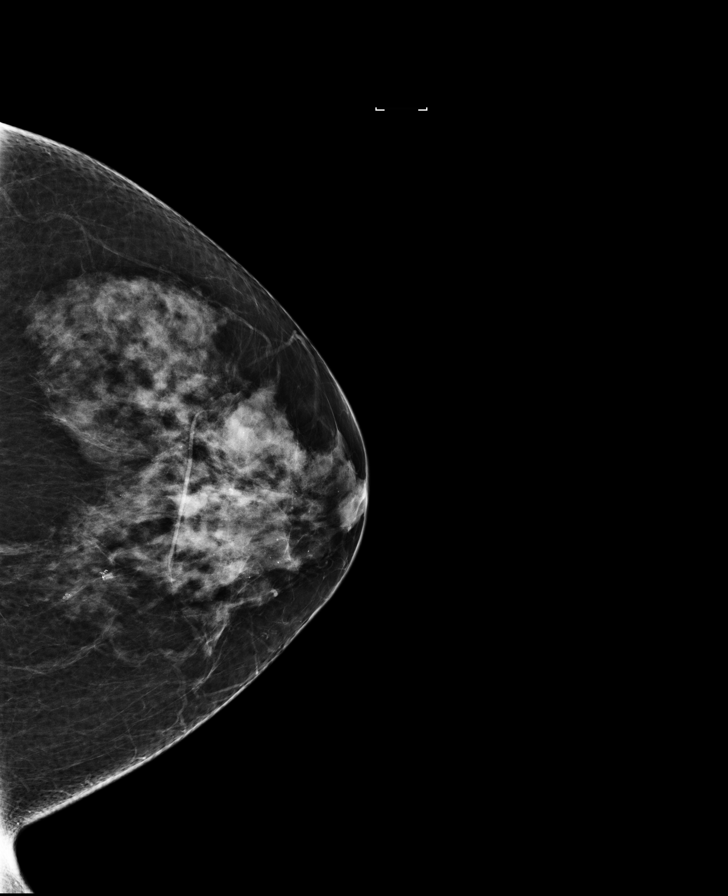

[L CC synth-2D]
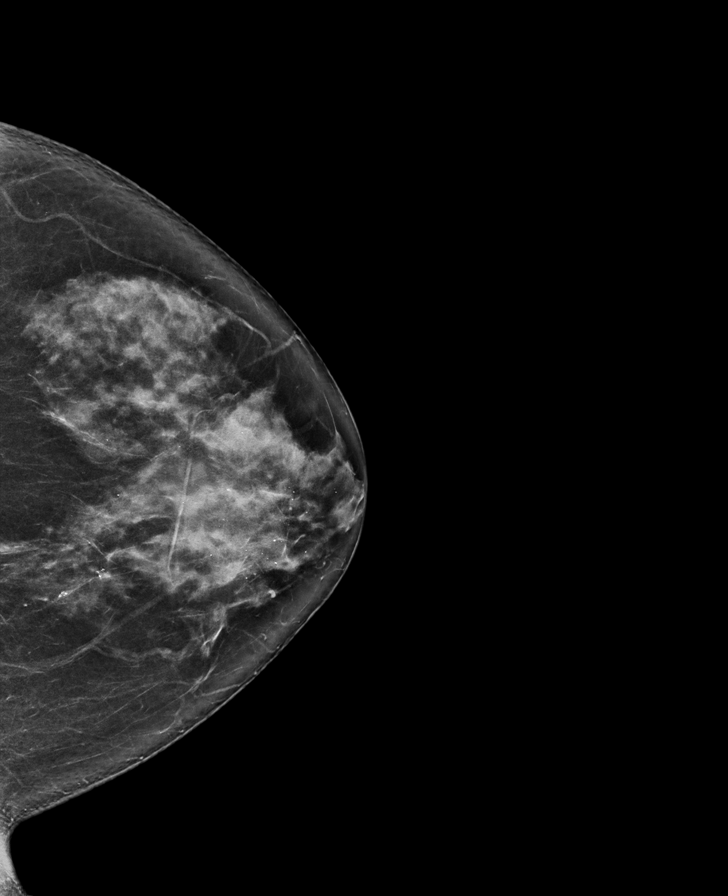

[R MLO synth-2D]
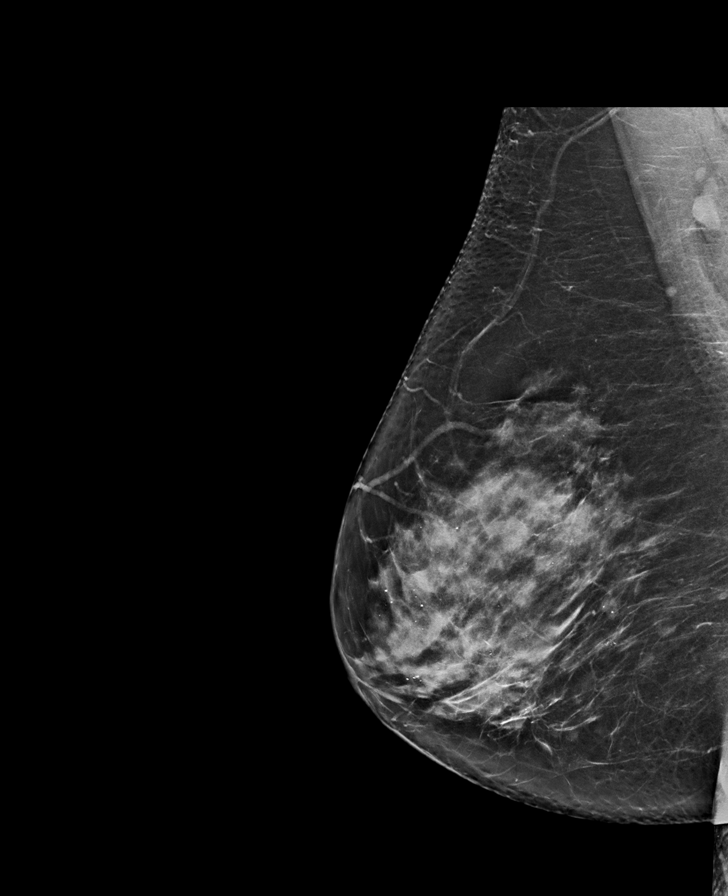

[L MLO synth-2D]
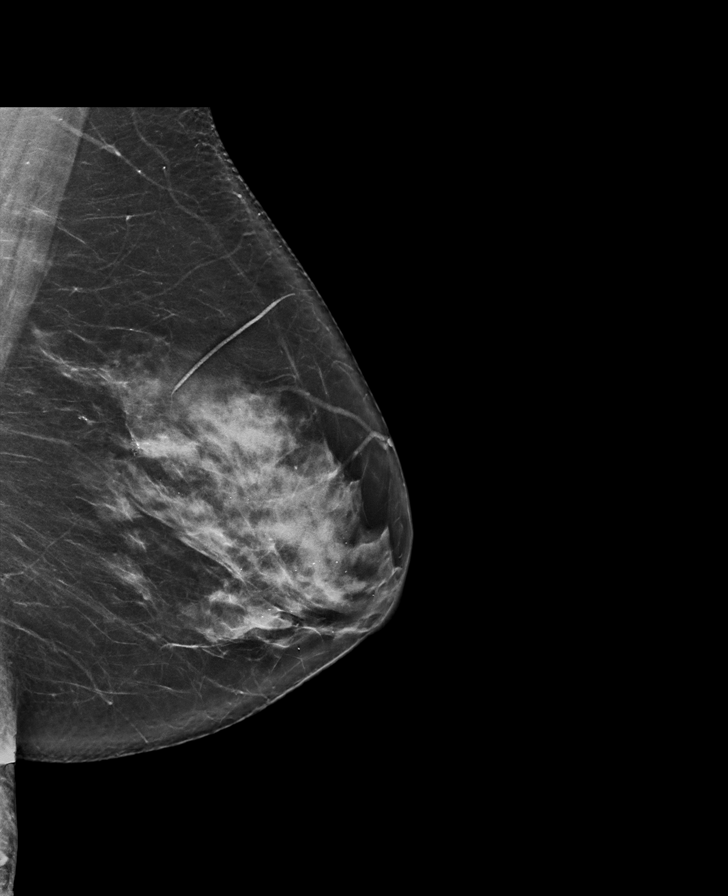

[L MLO]
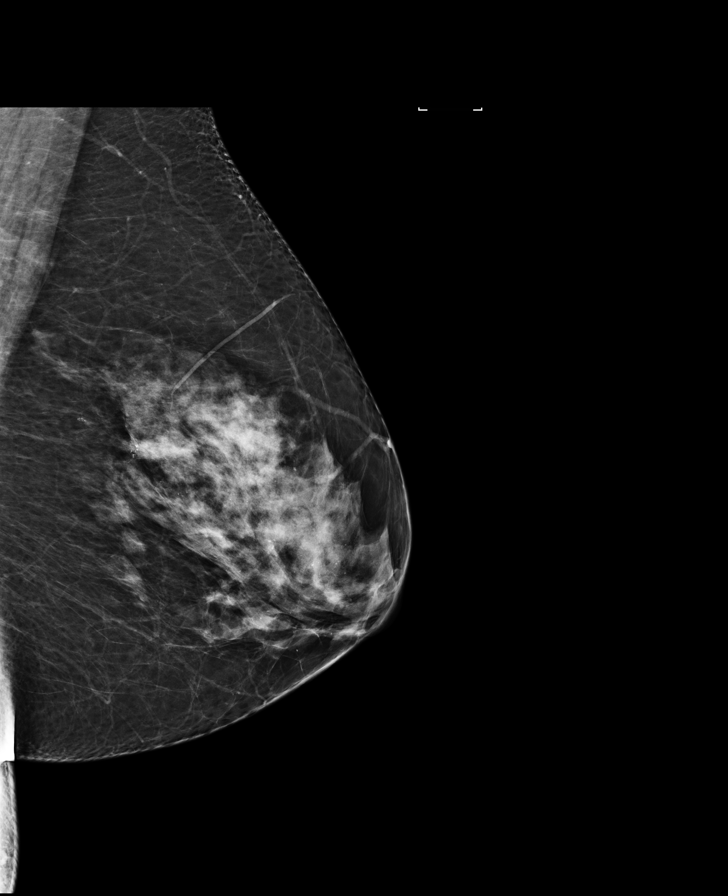

[R CC]
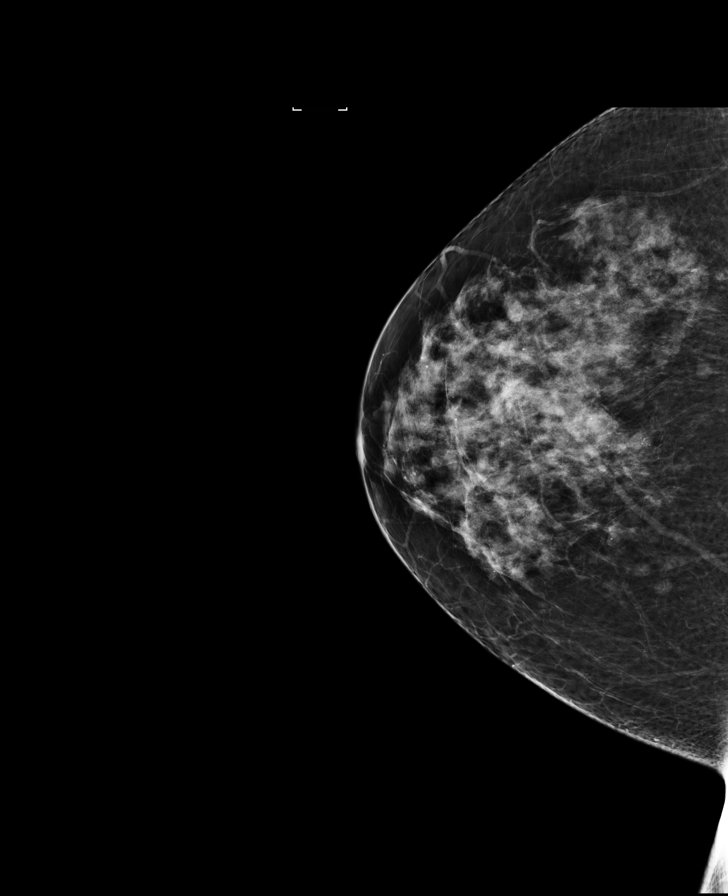

[8 of 28 positions shown; findings below may reference images not displayed]

ACR Breast Density Category c: The breast tissue is heterogeneously
dense, which may obscure small masses.
FINDINGS: There are no findings suspicious for malignancy. Images were
processed with CAD.
IMPRESSION: No mammographic evidence of malignancy. A result letter of this
screening mammogram will be mailed directly to the patient.

RECOMMENDATION:
Screening mammogram in one year. (Code:TN-[id number])

BI-RADS CATEGORY  1: Negative.

## 2019-05-19 ENCOUNTER — Other Ambulatory Visit: Payer: Self-pay | Admitting: Nurse Practitioner

## 2019-05-19 DIAGNOSIS — Z1231 Encounter for screening mammogram for malignant neoplasm of breast: Secondary | ICD-10-CM

## 2019-06-24 ENCOUNTER — Ambulatory Visit: Payer: 59

## 2019-06-29 ENCOUNTER — Other Ambulatory Visit: Payer: Self-pay

## 2019-06-29 ENCOUNTER — Ambulatory Visit
Admission: RE | Admit: 2019-06-29 | Discharge: 2019-06-29 | Disposition: A | Discharge status: Home / Self Care | Payer: Self-pay | Source: Ambulatory Visit | Attending: Nurse Practitioner | Admitting: Nurse Practitioner

## 2019-06-29 DIAGNOSIS — Z1231 Encounter for screening mammogram for malignant neoplasm of breast: Secondary | ICD-10-CM

## 2020-04-05 DIAGNOSIS — U071 COVID-19: Secondary | ICD-10-CM | POA: Diagnosis not present

## 2020-06-14 DIAGNOSIS — R300 Dysuria: Secondary | ICD-10-CM | POA: Diagnosis not present

## 2020-06-14 DIAGNOSIS — H8110 Benign paroxysmal vertigo, unspecified ear: Secondary | ICD-10-CM | POA: Diagnosis not present

## 2020-06-14 DIAGNOSIS — I10 Essential (primary) hypertension: Secondary | ICD-10-CM | POA: Diagnosis not present

## 2020-06-14 DIAGNOSIS — Z23 Encounter for immunization: Secondary | ICD-10-CM | POA: Diagnosis not present

## 2020-06-14 DIAGNOSIS — Z0000 Encounter for general adult medical examination without abnormal findings: Secondary | ICD-10-CM | POA: Diagnosis not present

## 2020-06-14 DIAGNOSIS — Z1211 Encounter for screening for malignant neoplasm of colon: Secondary | ICD-10-CM | POA: Diagnosis not present

## 2020-06-14 DIAGNOSIS — E785 Hyperlipidemia, unspecified: Secondary | ICD-10-CM | POA: Diagnosis not present

## 2020-06-14 DIAGNOSIS — E559 Vitamin D deficiency, unspecified: Secondary | ICD-10-CM | POA: Diagnosis not present

## 2020-08-23 ENCOUNTER — Ambulatory Visit (INDEPENDENT_AMBULATORY_CARE_PROVIDER_SITE_OTHER): Payer: Medicare Other | Admitting: Family Medicine

## 2020-08-23 ENCOUNTER — Encounter (INDEPENDENT_AMBULATORY_CARE_PROVIDER_SITE_OTHER): Payer: Self-pay | Admitting: Family Medicine

## 2020-08-23 ENCOUNTER — Other Ambulatory Visit: Payer: Self-pay

## 2020-08-23 VITALS — BP 143/83 | HR 74 | Temp 98.0°F | Ht 60.0 in | Wt 174.0 lb

## 2020-08-23 DIAGNOSIS — I10 Essential (primary) hypertension: Secondary | ICD-10-CM

## 2020-08-23 DIAGNOSIS — F411 Generalized anxiety disorder: Secondary | ICD-10-CM

## 2020-08-23 DIAGNOSIS — Z1331 Encounter for screening for depression: Secondary | ICD-10-CM | POA: Diagnosis not present

## 2020-08-23 DIAGNOSIS — R5383 Other fatigue: Secondary | ICD-10-CM | POA: Diagnosis not present

## 2020-08-23 DIAGNOSIS — R0602 Shortness of breath: Secondary | ICD-10-CM | POA: Diagnosis not present

## 2020-08-23 DIAGNOSIS — Z0289 Encounter for other administrative examinations: Secondary | ICD-10-CM

## 2020-08-23 DIAGNOSIS — E669 Obesity, unspecified: Secondary | ICD-10-CM

## 2020-08-23 DIAGNOSIS — Z6833 Body mass index (BMI) 33.0-33.9, adult: Secondary | ICD-10-CM | POA: Diagnosis not present

## 2020-08-23 DIAGNOSIS — R6889 Other general symptoms and signs: Secondary | ICD-10-CM | POA: Diagnosis not present

## 2020-08-24 LAB — COMPREHENSIVE METABOLIC PANEL
ALT: 13 IU/L (ref 0–32)
AST: 12 IU/L (ref 0–40)
Albumin/Globulin Ratio: 1.4 (ref 1.2–2.2)
Albumin: 4.2 g/dL (ref 3.8–4.8)
Alkaline Phosphatase: 85 IU/L (ref 44–121)
BUN/Creatinine Ratio: 18 (ref 12–28)
BUN: 14 mg/dL (ref 8–27)
Bilirubin Total: 0.5 mg/dL (ref 0.0–1.2)
CO2: 22 mmol/L (ref 20–29)
Calcium: 9.6 mg/dL (ref 8.7–10.3)
Chloride: 96 mmol/L (ref 96–106)
Creatinine, Ser: 0.79 mg/dL (ref 0.57–1.00)
Globulin, Total: 3.1 g/dL (ref 1.5–4.5)
Glucose: 99 mg/dL (ref 65–99)
Potassium: 4.1 mmol/L (ref 3.5–5.2)
Sodium: 137 mmol/L (ref 134–144)
Total Protein: 7.3 g/dL (ref 6.0–8.5)
eGFR: 82 mL/min/{1.73_m2} (ref >59)

## 2020-08-24 LAB — CBC WITH DIFFERENTIAL/PLATELET
Basophils Absolute: 0.1 10*3/uL (ref 0.0–0.2)
Basos: 1 %
EOS (ABSOLUTE): 0.2 10*3/uL (ref 0.0–0.4)
Eos: 3 %
Hematocrit: 45.3 % (ref 34.0–46.6)
Hemoglobin: 14.4 g/dL (ref 11.1–15.9)
Immature Grans (Abs): 0 10*3/uL (ref 0.0–0.1)
Immature Granulocytes: 0 %
Lymphocytes Absolute: 2.5 10*3/uL (ref 0.7–3.1)
Lymphs: 32 %
MCH: 29.3 pg (ref 26.6–33.0)
MCHC: 31.8 g/dL (ref 31.5–35.7)
MCV: 92 fL (ref 79–97)
Monocytes Absolute: 0.7 10*3/uL (ref 0.1–0.9)
Monocytes: 9 %
Neutrophils Absolute: 4.5 10*3/uL (ref 1.4–7.0)
Neutrophils: 55 %
Platelets: 176 10*3/uL (ref 150–450)
RBC: 4.92 x10E6/uL (ref 3.77–5.28)
RDW: 11.9 % (ref 11.7–15.4)
WBC: 8 10*3/uL (ref 3.4–10.8)

## 2020-08-24 LAB — LIPID PANEL WITH LDL/HDL RATIO
Cholesterol, Total: 184 mg/dL (ref 100–199)
HDL: 48 mg/dL (ref >39)
LDL Chol Calc (NIH): 111 mg/dL — ABNORMAL HIGH (ref 0–99)
LDL/HDL Ratio: 2.3 ratio (ref 0.0–3.2)
Triglycerides: 141 mg/dL (ref 0–149)
VLDL Cholesterol Cal: 25 mg/dL (ref 5–40)

## 2020-08-24 LAB — HEMOGLOBIN A1C
Est. average glucose Bld gHb Est-mCnc: 114 mg/dL
Hgb A1c MFr Bld: 5.6 % (ref 4.8–5.6)

## 2020-08-24 LAB — TSH: TSH: 3.31 u[IU]/mL (ref 0.450–4.500)

## 2020-08-24 LAB — VITAMIN B12: Vitamin B-12: 248 pg/mL (ref 232–1245)

## 2020-08-24 LAB — VITAMIN D 25 HYDROXY (VIT D DEFICIENCY, FRACTURES): Vit D, 25-Hydroxy: 52.6 ng/mL (ref 30.0–100.0)

## 2020-08-24 LAB — INSULIN, RANDOM: INSULIN: 18 u[IU]/mL (ref 2.6–24.9)

## 2020-08-24 LAB — FOLATE: Folate: 7.6 ng/mL (ref >3.0)

## 2020-08-24 LAB — T4, FREE: Free T4: 1.53 ng/dL (ref 0.82–1.77)

## 2020-08-24 LAB — T3: T3, Total: 161 ng/dL (ref 71–180)

## 2020-09-05 NOTE — Progress Notes (Signed)
Chief Complaint:   OBESITY Debra Macdonald (MR# [id number]) is a 65 y.o. female who presents for evaluation and treatment of obesity and related comorbidities. Current BMI is Body mass index is 33.98 kg/m. Debra Macdonald has been struggling with her weight for many years and has been unsuccessful in either losing weight, maintaining weight loss, or reaching her healthy weight goal.  Debra Macdonald is currently in the action stage of change and ready to dedicate time achieving and maintaining a healthier weight. Debra Macdonald is interested in becoming our patient and working on intensive lifestyle modifications including (but not limited to) diet and exercise for weight loss.  Debra Macdonald is retired.  Married to Debra Macdonald and also his caregiver.  He is 38 (his spinal cord is severed and he has decreased mobility).  She skips lunch most days.  Dislikes lunch meat.  Craves "anything", especially at night (chocolate).  Snacks on chips, cookies.  Drinks caloric beverages with sugar.  She sees a Tax adviser at [place] in [employer].  I do not have any records and they are not in the EMR.  Debra Macdonald's habits were reviewed today and are as follows: Her family eats meals together, she thinks her family will eat healthier with her, her desired weight loss is 60 pounds, she started gaining weight at age 54, her heaviest weight ever was 180 pounds, she craves anything (chocolate), she snacks frequently in the evenings, she wakes up frequently in the middle of the night to eat, she skips lunch frequently, she is frequently drinking liquids with calories, she frequently makes poor food choices, and she struggles with emotional eating.  Depression Screen [facility]'s Food and Mood (modified PHQ-9) score was 10.  Depression screen [facility] 2/9 08/23/2020  Decreased Interest 0  Down, Depressed, Hopeless 1  PHQ - 2 Score 1  Altered sleeping 1  Tired, decreased energy 3  Change in appetite 1  Feeling bad or failure about yourself  1  Trouble  concentrating 2  Moving slowly or fidgety/restless 1  Suicidal thoughts 0  PHQ-9 Score 10  Difficult doing work/chores Not difficult at all   Assessment/Plan:   Orders Placed This Encounter  Procedures   Vitamin B12   Lipid Panel With LDL/HDL Ratio   T3   T4, free   TSH   VITAMIN D 25 Hydroxy (Vit-D Deficiency, Fractures)   Hemoglobin A1c   Insulin, random   CBC with Differential/Platelet   Comprehensive metabolic panel   Folate   EKG 12-Lead   Medications Discontinued During This Encounter  Medication Reason   valsartan (DIOVAN) 160 MG tablet Error    1. Other fatigue Debra Macdonald admits to daytime somnolence and reports waking up still tired. Patent has a history of symptoms of daytime fatigue, morning fatigue, and snoring. Debra Macdonald generally gets 4 or 5 hours of sleep per night, and states that she has poor quality sleep. Snoring is present. Apneic episodes are not present. Epworth Sleepiness Score is 11.  Never diagnosed with OSA in the past or never had an evaluation.  Recently has increased snoring, per husband.  Debra Macdonald does feel that her weight is causing her energy to be lower than it should be. Fatigue may be related to obesity, depression or many other causes. Labs will be ordered, and in the meanwhile, Via will focus on self care including making healthy food choices, increasing physical activity and focusing on stress reduction.  Check labs and EKG today.  Recommend she follow-up with PCP regarding elevated ESS score  and discuss possible referral.  - EKG 12-Lead - Vitamin B12 - Lipid Panel With LDL/HDL Ratio - T3 - T4, free - TSH - VITAMIN D 25 Hydroxy (Vit-D Deficiency, Fractures) - Hemoglobin A1c - Insulin, random - CBC with Differential/Platelet - Comprehensive metabolic panel - Folate  2. Shortness of breath on exertion Debra Macdonald notes increasing shortness of breath with exercising and seems to be worsening over time with weight gain. She notes getting out  of breath sooner with activity than she used to. This has gotten worse recently. Debra Macdonald denies shortness of breath at rest or orthopnea.  Debra Macdonald does feel that she gets out of breath more easily that she used to when she exercises. Debra Macdonald's shortness of breath appears to be obesity related and exercise induced. She has agreed to work on weight loss and gradually increase exercise to treat her exercise induced shortness of breath. Will continue to monitor closely.  Check IC today.  3. Essential hypertension At goal. Medications: Hyzaar 100-12.5 mg daily.  Diagnosed several years ago.  At home, blood pressure runs 130s/70 most of the time.  Rarely over [name] systolic.  Plan:  Essentially at goal for age and well controlled at home.  Continue medication and follow prudent nutritional plan.  Continue home blood pressure monitoring.  Avoid buying foods that are: processed, frozen, or prepackaged to avoid excess salt. We will watch for signs of hypotension as she continues lifestyle modifications. We will continue to monitor closely alongside her PCP and/or Specialist.  Regular follow up with PCP and specialists was also encouraged.   BP Readings from Last 3 Encounters:  08/23/20 (!) 143/83  08/28/11 127/68  10/04/10 137/74   Lab Results  Component Value Date   CREATININE 0.79 08/23/2020   - Vitamin B12 - T3 - T4, free - TSH - Hemoglobin A1c - Insulin, random - CBC with Differential/Platelet - Comprehensive metabolic panel  4. GAD (generalized anxiety disorder), with mild emotional eating Controlled. Medication: Wellbutrin XL 150 mg daily and Xanax 0.25 mg as needed.  Takes Wellbutrin daily mostly for panic/GAD.  PHQ-9 is 10.  Started on Wellbutrin during menopause around 18 years ago.  Also takes Xanax as needed for panic attacks every 3 months.  She denies emotional eating as a current issue.  Plan:  Controlled.  Continue medications per PCP.  Will closely monitor throughout journey.  5.  Depression screening [name] was screened for depression as part of her new patient workup today.  PHQ-9 is 10.  Debra Macdonald had a positive depression screening. Depression is commonly associated with obesity and often results in emotional eating behaviors. We will monitor this closely and work on CBT to help improve the non-hunger eating patterns. Referral to Psychology may be required if no improvement is seen as she continues in our clinic.  6. Class 1 obesity with serious comorbidity and body mass index (BMI) of 33.0 to 33.9 in adult, unspecified obesity type  Babette is currently in the action stage of change and her goal is to continue with weight loss efforts. I recommend Posey begin the structured treatment plan as follows:  She has agreed to the Category 1 Plan.  Exercise goals:  As is.    Behavioral modification strategies: increasing water intake and planning for success.  She was informed of the importance of frequent follow-up visits to maximize her success with intensive lifestyle modifications for her multiple health conditions. She was informed we would discuss her lab results at her next visit unless there is  a critical issue that needs to be addressed sooner. Carlisa agreed to keep her next visit at the agreed upon time to discuss these results.  Objective:   Blood pressure (!) 143/83, pulse 74, temperature 98 F (36.7 C), height 5' (1.524 m), weight 174 lb (78.9 kg), SpO2 96 %. Body mass index is 33.98 kg/m.  EKG: Normal sinus rhythm, rate 76 bpm.  Indirect Calorimeter completed today shows a VO2 of 188 and a REE of 1311.  Her calculated basal metabolic rate is [id number] thus her basal metabolic rate is better than expected.  General: Cooperative, alert, well developed, in no acute distress. HEENT: Conjunctivae and lids unremarkable. Cardiovascular: Regular rhythm.  Lungs: Normal work of breathing. Neurologic: No focal deficits.   Lab Results  Component Value Date    CREATININE 0.79 08/23/2020   BUN 14 08/23/2020   NA 137 08/23/2020   K 4.1 08/23/2020   CL 96 08/23/2020   CO2 22 08/23/2020   Lab Results  Component Value Date   ALT 13 08/23/2020   AST 12 08/23/2020   ALKPHOS 85 08/23/2020   BILITOT 0.5 08/23/2020   Lab Results  Component Value Date   HGBA1C 5.6 08/23/2020   Lab Results  Component Value Date   INSULIN 18.0 08/23/2020   Lab Results  Component Value Date   TSH 3.310 08/23/2020   Lab Results  Component Value Date   CHOL 184 08/23/2020   HDL 48 08/23/2020   LDLCALC 111 (H) 08/23/2020   TRIG 141 08/23/2020   Lab Results  Component Value Date   VD25OH 52.6 08/23/2020   Lab Results  Component Value Date   WBC 8.0 08/23/2020   HGB 14.4 08/23/2020   HCT 45.3 08/23/2020   MCV 92 08/23/2020   PLT 176 08/23/2020   Obesity Behavioral Intervention:   Approximately 15 minutes were spent on the discussion below.  ASK: We discussed the diagnosis of obesity with Posey Pronto today and Jorey agreed to give [name] permission to discuss obesity behavioral modification therapy today.  ASSESS: Taraoluwa has the diagnosis of obesity and her BMI today is 34.0. Audryna is in the action stage of change.   ADVISE: Shadavia was educated on the multiple health risks of obesity as well as the benefit of weight loss to improve her health. She was advised of the need for long term treatment and the importance of lifestyle modifications to improve her current health and to decrease her risk of future health problems.  AGREE: Multiple dietary modification options and treatment options were discussed and Lorren agreed to follow the recommendations documented in the above note.  ARRANGE: Rajene was educated on the importance of frequent visits to treat obesity as outlined per CMS and USPSTF guidelines and agreed to schedule her next follow up appointment today.  Attestation Statements:   This is the patient's first visit at [facility]. The patient's NEW PATIENT PACKET was reviewed at length. Included in the packet: current and past health history, medications, allergies, ROS, gynecologic history (women only), surgical history, family history, social history, weight history, weight loss surgery history (for those that have had weight loss surgery), nutritional evaluation, mood and food questionnaire, PHQ9, Epworth questionnaire, sleep habits questionnaire, patient life and health improvement goals questionnaire. These will all be scanned into the patient's chart under media.   During the visit, I independently reviewed the patient's EKG, bioimpedance scale results, and indirect calorimeter results. I used this information to tailor a meal plan for the  patient that will help her to lose weight and will improve her obesity-related conditions going forward. I performed a medically necessary appropriate examination and/or evaluation. I discussed the assessment and treatment plan with the patient. The patient was provided an opportunity to ask questions and all were answered. The patient agreed with the plan and demonstrated an understanding of the instructions. Labs were ordered at this visit and will be reviewed at the next visit unless more critical results need to be addressed immediately. Clinical information was updated and documented in the EMR.   I, Water quality scientist, CMA, am acting as Location manager for [employer], DO.  I have reviewed the above documentation for accuracy and completeness, and I agree with the above. Marjory Sneddon, D.O.  The [place] was signed into law in 2016 which includes the topic of electronic health records.  This provides immediate access to information in MyChart.  This includes consultation notes, operative notes, office notes, lab results and pathology reports.  If you have any questions about what you read please let us know at your next visit so we can discuss your concerns  and take corrective action if need be.  We are right here with you.

## 2020-09-06 ENCOUNTER — Encounter (INDEPENDENT_AMBULATORY_CARE_PROVIDER_SITE_OTHER): Payer: Self-pay | Admitting: Family Medicine

## 2020-09-06 ENCOUNTER — Other Ambulatory Visit: Payer: Self-pay

## 2020-09-06 ENCOUNTER — Ambulatory Visit (INDEPENDENT_AMBULATORY_CARE_PROVIDER_SITE_OTHER): Payer: Medicare Other | Admitting: Family Medicine

## 2020-09-06 VITALS — BP 125/84 | HR 70 | Temp 98.2°F | Ht 60.0 in | Wt 170.0 lb

## 2020-09-06 DIAGNOSIS — Z6833 Body mass index (BMI) 33.0-33.9, adult: Secondary | ICD-10-CM | POA: Diagnosis not present

## 2020-09-06 DIAGNOSIS — E8881 Metabolic syndrome: Secondary | ICD-10-CM | POA: Diagnosis not present

## 2020-09-06 DIAGNOSIS — E669 Obesity, unspecified: Secondary | ICD-10-CM

## 2020-09-06 DIAGNOSIS — E785 Hyperlipidemia, unspecified: Secondary | ICD-10-CM | POA: Diagnosis not present

## 2020-09-06 DIAGNOSIS — E559 Vitamin D deficiency, unspecified: Secondary | ICD-10-CM | POA: Diagnosis not present

## 2020-09-06 DIAGNOSIS — E538 Deficiency of other specified B group vitamins: Secondary | ICD-10-CM

## 2020-09-06 DIAGNOSIS — F411 Generalized anxiety disorder: Secondary | ICD-10-CM

## 2020-09-06 DIAGNOSIS — I10 Essential (primary) hypertension: Secondary | ICD-10-CM | POA: Diagnosis not present

## 2020-09-06 MED ORDER — VITAMIN B-12 100 MCG PO TABS: ORAL_TABLET | ORAL | Status: DC

## 2020-09-06 MED ORDER — ROSUVASTATIN CALCIUM 5 MG PO TABS: ORAL_TABLET | ORAL | 0 refills | Status: DC

## 2020-09-06 NOTE — Patient Instructions (Signed)
The 10-year ASCVD risk score Mikey Bussing DC Brooke Bonito., et al., 2013) is: 8%   Values used to calculate the score:     Age: 65 years     Sex: Female     Is Non-Hispanic African American: No     Diabetic: No     Tobacco smoker: No     Systolic Blood Pressure: 0000000 mmHg     Is BP treated: Yes     HDL Cholesterol: 48 mg/dL     Total Cholesterol: 184 mg/dL

## 2020-09-11 NOTE — Progress Notes (Signed)
Chief Complaint:   OBESITY Debra Macdonald is here to discuss her progress with her obesity treatment plan along with follow-up of her obesity related diagnoses. Debra Macdonald is on the Category 1 Plan and states she is following her eating plan approximately 96% of the time. Debra Macdonald states she is not currently exercising.  Today's visit was #: 2 Starting weight: 174 lbs Starting date: 08/23/2020 Today's weight: 170 Today's date: 09/06/2020 Total lbs lost to date: 4 Total lbs lost since last in-office visit: 4  Interim History: Debra Macdonald is here today for her first follow-up office visit since starting the program with [name].  All blood work/ lab tests that were recently ordered by myself or an outside provider were reviewed with patient today per their request.   Extended time was spent counseling her on all new disease processes that were discovered or preexisting ones that are worsening.  she understands that many of these abnormalities will need to monitored regularly along with the current treatment plan of prudent dietary changes, in which we are making each and every office visit, to improve these health parameters.  Calesha denies hunger or cravings. She doesn't like [place] yogurt. She reports it was difficult to eat all foods but she did.  Subjective:   1. Essential hypertension Discussed labs with patient today. Debra Macdonald is taking Hyzaar and Macdonald ASA 81 mg.  BP Readings from Last 3 Encounters:  09/06/20 125/84  08/23/20 (!) 143/83  08/28/11 127/68   Lab Results  Component Value Date   CREATININE 0.79 08/23/2020   CREATININE 0.80 08/28/2011   CREATININE 0.76 10/04/2010   2. GAD (generalized anxiety disorder) Discussed labs with patient today. Debra Macdonald denies emotional eating.  3. Insulin resistance New. Discussed labs with patient today. Debra Macdonald has Macdonald diagnosis of insulin resistance based on her elevated fasting insulin level >5. She continues to work on diet and exercise to  decrease her risk of diabetes.  Lab Results  Component Value Date   INSULIN 18.0 08/23/2020   Lab Results  Component Value Date   HGBA1C 5.6 08/23/2020   4. B12 deficiency New. Discussed labs with patient today. She notes fatigue. She is not Macdonald vegetarian.  She does not have Macdonald previous diagnosis of pernicious anemia.  She does not have Macdonald history of weight loss surgery.   Lab Results  Component Value Date   VITAMINB12 248 08/23/2020   5. Hyperlipidemia, unspecified hyperlipidemia type New. Discussed labs with patient today. Debra Macdonald has an elevated LDL and elevated 10 year risk of >7.5%. Her mom and dad died of CVA. Dad passed away at age 47.  Lab Results  Component Value Date   CHOL 184 08/23/2020   HDL 48 08/23/2020   LDLCALC 111 (H) 08/23/2020   TRIG 141 08/23/2020   Lab Results  Component Value Date   ALT 13 08/23/2020   AST 12 08/23/2020   ALKPHOS 85 08/23/2020   BILITOT 0.5 08/23/2020   The 10-year ASCVD risk score Debra Bussing DC Jr., et al., 2013) is: 8%   Values used to calculate the score:     Age: 65 years     Sex: Female     Is Non-Hispanic African American: No     Diabetic: No     Tobacco smoker: No     Systolic Blood Pressure: 0000000 mmHg     Is BP treated: Yes     HDL Cholesterol: 48 mg/dL     Total Cholesterol: 184 mg/dL  6. Vitamin  D deficiency Discussed labs with patient today. She is currently taking OTC vitamin D ? each day. She denies nausea, vomiting or muscle weakness.  Lab Results  Component Value Date   VD25OH 52.6 08/23/2020   Assessment/Plan:  No orders of the defined types were placed in this encounter.   There are no discontinued medications.   Meds ordered this encounter  Medications   vitamin B-12 (CYANOCOBALAMIN) 100 MCG tablet    Sig: 100- 400 mcg qd   rosuvastatin (CRESTOR) 5 MG tablet    Sig: 1 po every M, W, F night b-4 bed    Dispense:  30 tablet    Refill:  0    Needs ov for RF!     1. Essential hypertension Gertha's  BP is much better controlled today versus last OV. She is working on healthy weight loss and exercise to improve blood pressure control. We will watch for signs of hypotension as she continues her lifestyle modifications.  2. GAD (generalized anxiety disorder) Stable. Behavior modification techniques were discussed today to help Debra Macdonald deal with her anxiety.  Orders and follow up as documented in patient record.   3. Insulin resistance Debra Macdonald declines medication. will continue to work on weight loss, exercise, and decreasing simple carbohydrates to help decrease the risk of diabetes. Debra Macdonald agreed to follow-up with [name] as directed to closely monitor her progress. Handout on Insulin Resistance provided to pt.  4. B12 deficiency Pt will start B12 100-400 mcg QD. The diagnosis was reviewed with the patient. Counseling provided today, see below. We will continue to monitor. Orders and follow up as documented in patient record.  Counseling The body needs vitamin B12: to make red blood cells; to make DNA; and to help the nerves work properly so they can carry messages from the brain to the body.  The main causes of vitamin B12 deficiency include dietary deficiency, digestive diseases, pernicious anemia, and having Macdonald surgery in which part of the stomach or small intestine is removed.  Certain medicines can make it harder for the body to absorb vitamin B12. These medicines include: heartburn medications; some antibiotics; some medications used to treat diabetes, gout, and high cholesterol.  In some cases, there are no symptoms of this condition. If the condition leads to anemia or nerve damage, various symptoms can occur, such as weakness or fatigue, shortness of breath, and numbness or tingling in your hands and feet.   Treatment:  May include taking vitamin B12 supplements.  Avoid alcohol.  Eat lots of healthy foods that contain vitamin B12: Beef, pork, chicken, [place], and organ meats, such as  liver.  Seafood: This includes clams, rainbow trout, salmon, tuna, and haddock. Eggs.  Cereal and dairy products that are fortified: This means that vitamin B12 has been added to the food.   Start- vitamin B-12 (CYANOCOBALAMIN) 100 MCG tablet; 100- 400 mcg qd  5. Hyperlipidemia, unspecified hyperlipidemia type Debra Macdonald is to start Crestor 5 mg, as prescribed below. Focus on prudent nutritional plan and decrease sat/trans fats. Cardiovascular risk and specific lipid/LDL goals reviewed.  We discussed several lifestyle modifications today and Debra Macdonald will continue to work on diet, exercise and weight loss efforts. Orders and follow up as documented in patient record. Recheck labs in 2-3 months, including ALT.  Counseling Intensive lifestyle modifications are the first line treatment for this issue. Dietary changes: Increase soluble fiber. Decrease simple carbohydrates. Exercise changes: Moderate to vigorous-intensity aerobic activity 150 minutes per week if tolerated. Lipid-lowering medications: see documented  in medical record.  Start- rosuvastatin (CRESTOR) 5 MG tablet; 1 po every M, W, F night b-4 bed  Dispense: 30 tablet; Refill: 0  6. Vitamin D deficiency At goal. Low Vitamin D level contributes to fatigue and are associated with obesity, breast, and colon cancer. She agrees to continue to take OTC Vitamin D daily and will follow-up for routine testing of Vitamin D, at least 2-3 times per year to avoid over-replacement.  7. Obesity with current BMI Of 33.4  Debra Macdonald is currently in the action stage of change. As such, her goal is to continue with weight loss efforts. She has agreed to the Category 1 Plan.   Exercise goals:  As is  Behavioral modification strategies: increasing lean protein intake, decreasing simple carbohydrates, increasing water intake, and planning for success.  Debra Macdonald has agreed to follow-up with our clinic in 2 weeks. She was informed of the importance of frequent  follow-up visits to maximize her success with intensive lifestyle modifications for her multiple health conditions.   Objective:   Blood pressure 125/84, pulse 70, temperature 98.2 F (36.8 C), height 5' (1.524 m), weight 170 lb (77.1 kg), SpO2 97 %. Body mass index is 33.2 kg/m.  General: Cooperative, alert, well developed, in no acute distress. HEENT: Conjunctivae and lids unremarkable. Cardiovascular: Regular rhythm.  Lungs: Normal work of breathing. Neurologic: No focal deficits.   Lab Results  Component Value Date   CREATININE 0.79 08/23/2020   BUN 14 08/23/2020   NA 137 08/23/2020   K 4.1 08/23/2020   CL 96 08/23/2020   CO2 22 08/23/2020   Lab Results  Component Value Date   ALT 13 08/23/2020   AST 12 08/23/2020   ALKPHOS 85 08/23/2020   BILITOT 0.5 08/23/2020   Lab Results  Component Value Date   HGBA1C 5.6 08/23/2020   Lab Results  Component Value Date   INSULIN 18.0 08/23/2020   Lab Results  Component Value Date   TSH 3.310 08/23/2020   Lab Results  Component Value Date   CHOL 184 08/23/2020   HDL 48 08/23/2020   LDLCALC 111 (H) 08/23/2020   TRIG 141 08/23/2020   Lab Results  Component Value Date   VD25OH 52.6 08/23/2020   Lab Results  Component Value Date   WBC 8.0 08/23/2020   HGB 14.4 08/23/2020   HCT 45.3 08/23/2020   MCV 92 08/23/2020   PLT 176 08/23/2020   No results found for: IRON, TIBC, FERRITIN  Obesity Behavioral Intervention:   Approximately 15 minutes were spent on the discussion below.  ASK: We discussed the diagnosis of obesity with Debra Macdonald today and Debra Macdonald agreed to give [name] permission to discuss obesity behavioral modification therapy today.  ASSESS: Debra Macdonald has the diagnosis of obesity and her BMI today is 33.4. Kimiye is in the action stage of change.   ADVISE: Zakiyah was educated on the multiple health risks of obesity as well as the benefit of weight loss to improve her health. She was advised of the need for  long term treatment and the importance of lifestyle modifications to improve her current health and to decrease her risk of future health problems.  AGREE: Multiple dietary modification options and treatment options were discussed and Carola agreed to follow the recommendations documented in the above note.  ARRANGE: Romayne was educated on the importance of frequent visits to treat obesity as outlined per CMS and USPSTF guidelines and agreed to schedule her next follow up appointment today.  Attestation Statements:  Reviewed by clinician on day of visit: allergies, medications, problem list, medical history, surgical history, family history, social history, and previous encounter notes.  Coral Ceo, CMA, am acting as transcriptionist for [employer], DO.  I have reviewed the above documentation for accuracy and completeness, and I agree with the above. Marjory Sneddon, D.O.  The [place] was signed into law in 2016 which includes the topic of electronic health records.  This provides immediate access to information in MyChart.  This includes consultation notes, operative notes, office notes, lab results and pathology reports.  If you have any questions about what you read please let us know at your next visit so we can discuss your concerns and take corrective action if need be.  We are right here with you.

## 2020-09-20 ENCOUNTER — Encounter (INDEPENDENT_AMBULATORY_CARE_PROVIDER_SITE_OTHER): Payer: Self-pay

## 2020-09-21 ENCOUNTER — Ambulatory Visit (INDEPENDENT_AMBULATORY_CARE_PROVIDER_SITE_OTHER): Payer: Medicare Other | Admitting: Family Medicine

## 2020-09-29 ENCOUNTER — Ambulatory Visit (INDEPENDENT_AMBULATORY_CARE_PROVIDER_SITE_OTHER): Payer: Medicare Other | Admitting: Family Medicine

## 2020-09-30 ENCOUNTER — Other Ambulatory Visit (INDEPENDENT_AMBULATORY_CARE_PROVIDER_SITE_OTHER): Payer: Self-pay | Admitting: Family Medicine

## 2020-09-30 DIAGNOSIS — E785 Hyperlipidemia, unspecified: Secondary | ICD-10-CM

## 2020-10-03 NOTE — Telephone Encounter (Signed)
Last OV with Dr Opalski 

## 2020-10-05 ENCOUNTER — Ambulatory Visit (INDEPENDENT_AMBULATORY_CARE_PROVIDER_SITE_OTHER): Payer: Medicare Other | Admitting: Family Medicine

## 2020-10-05 ENCOUNTER — Encounter (INDEPENDENT_AMBULATORY_CARE_PROVIDER_SITE_OTHER): Payer: Self-pay | Admitting: Family Medicine

## 2020-10-05 ENCOUNTER — Other Ambulatory Visit: Payer: Self-pay

## 2020-10-05 VITALS — BP 110/70 | HR 73 | Temp 98.4°F | Ht 60.0 in | Wt 163.0 lb

## 2020-10-05 DIAGNOSIS — E538 Deficiency of other specified B group vitamins: Secondary | ICD-10-CM | POA: Diagnosis not present

## 2020-10-05 DIAGNOSIS — Z6833 Body mass index (BMI) 33.0-33.9, adult: Secondary | ICD-10-CM

## 2020-10-05 DIAGNOSIS — E785 Hyperlipidemia, unspecified: Secondary | ICD-10-CM

## 2020-10-05 DIAGNOSIS — E669 Obesity, unspecified: Secondary | ICD-10-CM

## 2020-10-05 MED ORDER — ROSUVASTATIN CALCIUM 5 MG PO TABS: ORAL_TABLET | ORAL | 0 refills | Status: DC

## 2020-10-06 NOTE — Progress Notes (Signed)
Chief Complaint:   OBESITY Debra Macdonald is here to discuss her progress with her obesity treatment plan along with follow-up of her obesity related diagnoses. [name] is on the Category 1 Plan and states she is following her eating plan approximately 95-96% of the time. [name] states she is walking for 35 minutes 3 times per week.  Today's visit was #: 3 Starting weight: 174 lbs Starting date: 08/23/2020 Today's weight: 163 lbs Today's date: 10/05/2020 Total lbs lost to date: 11 Total lbs lost since last in-office visit: 7  Interim History: Debra Macdonald is here for a follow up office visit.  We reviewed her meal plan and questions were answered.  Patient's food recall appears to be accurate and consistent with what is on plan when she is following it. When eating on plan, her hunger and cravings are well controlled. She is very happy with herself and her emotions are under better control.   Subjective:   1. Hyperlipidemia, unspecified hyperlipidemia type [name] started Crestor at her last office visit and she is tolerating it well. She has no medication compliance issues.  2. B12 deficiency [name] started OTC B12, and she is tolerating it well. She has no issues. She feels better and has more energy.  Assessment/Plan:  No orders of the defined types were placed in this encounter.   Medications Discontinued During This Encounter  Medication Reason   rosuvastatin (CRESTOR) 5 MG tablet Reorder   rosuvastatin (CRESTOR) 5 MG tablet Reorder     Meds ordered this encounter  Medications   DISCONTD: rosuvastatin (CRESTOR) 5 MG tablet    Sig: 1 po every M, W, F night b-4 bed    Dispense:  30 tablet    Refill:  0    Needs ov for RF!   rosuvastatin (CRESTOR) 5 MG tablet    Sig: 1 po every M, W, F night b-4 bed    Dispense:  30 tablet    Refill:  0    Needs ov for RF!     1. Hyperlipidemia, unspecified hyperlipidemia type Debra Macdonald reports compliance with meds and/or  treatment plan such as low saturated and trans fat low cholesterol meal plan. We will refill Crestor for 1 month.  - Rec: aerobic activity with eventual goal of a minimum of 150+ min wk plus 2 days/ week of resistance strength training  - Cardiovascular risk and specific lipid/LDL goals reviewed.  We discussed several lifestyle modifications today and Bronislawa will continue to work on diet, exercise and weight loss efforts.   - Will continue routine screening as patient continues with health goals and weight loss journey  Last lipid panel as follows:  Lab Results  Component Value Date   CHOL 184 08/23/2020   HDL 48 08/23/2020   LDLCALC 111 (H) 08/23/2020   TRIG 141 08/23/2020    Lab Results  Component Value Date   ALT 13 08/23/2020    The 10-year ASCVD risk score Debra Bussing DC Jr., et al., 2013) is: 6.2%  - rosuvastatin (CRESTOR) 5 MG tablet; 1 po every M, W, F night b-4 bed  Dispense: 30 tablet; Refill: 0  2. B12 deficiency The diagnosis was reviewed with the patient. Counseling provided today, see below. [name] will continue OTC B12, and we will recheck labs in 3-4 months. Orders and follow up as documented in patient record.  Counseling The body needs vitamin B12: to make red blood cells; to make DNA; and to help the nerves work  properly so they can carry messages from the brain to the body.  The main causes of vitamin B12 deficiency include dietary deficiency, digestive diseases, pernicious anemia, and having a surgery in which part of the stomach or small intestine is removed.  Certain medicines can make it harder for the body to absorb vitamin B12. These medicines include: heartburn medications; some antibiotics; some medications used to treat diabetes, gout, and high cholesterol.  In some cases, there are no symptoms of this condition. If the condition leads to anemia or nerve damage, various symptoms can occur, such as weakness or fatigue, shortness of breath, and numbness or  tingling in your hands and feet.   Treatment:  May include taking vitamin B12 supplements.  Avoid alcohol.  Eat lots of healthy foods that contain vitamin B12: Beef, pork, chicken, [place], and organ meats, such as liver.  Seafood: This includes clams, rainbow trout, salmon, tuna, and haddock. Eggs.  Cereal and dairy products that are fortified: This means that vitamin B12 has been added to the food.    3. Obesity with current BMI of 32.0 [name] is currently in the action stage of change. As such, her goal is to continue with weight loss efforts. She has agreed to the Category 1 Plan.   Exercise goals: For substantial health benefits, adults should do at least 150 minutes (2 hours and 30 minutes) a week of moderate-intensity, or 75 minutes (1 hour and 15 minutes) a week of vigorous-intensity aerobic physical activity, or an equivalent combination of moderate- and vigorous-intensity aerobic activity. Aerobic activity should be performed in episodes of at least 10 minutes, and preferably, it should be spread throughout the week.  Behavioral modification strategies: increasing lean protein intake, keeping healthy foods in the home, and planning for success.  [name] has agreed to follow-up with our clinic in 2 to 3 weeks. She was informed of the importance of frequent follow-up visits to maximize her success with intensive lifestyle modifications for her multiple health conditions.   Objective:   Blood pressure 110/70, pulse 73, temperature 98.4 F (36.9 C), height 5' (1.524 m), weight 163 lb (73.9 kg), SpO2 97 %. Body mass index is 31.83 kg/m.  General: Cooperative, alert, well developed, in no acute distress. HEENT: Conjunctivae and lids unremarkable. Cardiovascular: Regular rhythm.  Lungs: Normal work of breathing. Neurologic: No focal deficits.   Lab Results  Component Value Date   CREATININE 0.79 08/23/2020   BUN 14 08/23/2020   NA 137 08/23/2020   K 4.1 08/23/2020   CL 96  08/23/2020   CO2 22 08/23/2020   Lab Results  Component Value Date   ALT 13 08/23/2020   AST 12 08/23/2020   ALKPHOS 85 08/23/2020   BILITOT 0.5 08/23/2020   Lab Results  Component Value Date   HGBA1C 5.6 08/23/2020   Lab Results  Component Value Date   INSULIN 18.0 08/23/2020   Lab Results  Component Value Date   TSH 3.310 08/23/2020   Lab Results  Component Value Date   CHOL 184 08/23/2020   HDL 48 08/23/2020   LDLCALC 111 (H) 08/23/2020   TRIG 141 08/23/2020   Lab Results  Component Value Date   VD25OH 52.6 08/23/2020   Lab Results  Component Value Date   WBC 8.0 08/23/2020   HGB 14.4 08/23/2020   HCT 45.3 08/23/2020   MCV 92 08/23/2020   PLT 176 08/23/2020   No results found for: IRON, TIBC, FERRITIN  Obesity Behavioral Intervention:   Approximately  15 minutes were spent on the discussion below.  ASK: We discussed the diagnosis of obesity with Posey Pronto today and Chantilly agreed to give [name] permission to discuss obesity behavioral modification therapy today.  ASSESS: Geoffrey has the diagnosis of obesity and her BMI today is 32.0. Lisaann is in the action stage of change.   ADVISE: Amyah was educated on the multiple health risks of obesity as well as the benefit of weight loss to improve her health. She was advised of the need for long term treatment and the importance of lifestyle modifications to improve her current health and to decrease her risk of future health problems.  AGREE: Multiple dietary modification options and treatment options were discussed and Oksana agreed to follow the recommendations documented in the above note.  ARRANGE: Mariangela was educated on the importance of frequent visits to treat obesity as outlined per CMS and USPSTF guidelines and agreed to schedule her next follow up appointment today.  Attestation Statements:   Reviewed by clinician on day of visit: allergies, medications, problem list, medical history, surgical  history, family history, social history, and previous encounter notes.   Wilhemena Durie, am acting as transcriptionist for [employer], DO.  I have reviewed the above documentation for accuracy and completeness, and I agree with the above. Marjory Sneddon, D.O.  The [place] was signed into law in 2016 which includes the topic of electronic health records.  This provides immediate access to information in MyChart.  This includes consultation notes, operative notes, office notes, lab results and pathology reports.  If you have any questions about what you read please let us know at your next visit so we can discuss your concerns and take corrective action if need be.  We are right here with you.

## 2020-10-19 ENCOUNTER — Encounter (INDEPENDENT_AMBULATORY_CARE_PROVIDER_SITE_OTHER): Payer: Self-pay | Admitting: Family Medicine

## 2020-10-19 ENCOUNTER — Ambulatory Visit (INDEPENDENT_AMBULATORY_CARE_PROVIDER_SITE_OTHER): Payer: Medicare Other | Admitting: Family Medicine

## 2020-10-19 ENCOUNTER — Other Ambulatory Visit: Payer: Self-pay

## 2020-10-19 VITALS — BP 124/79 | HR 73 | Temp 98.2°F | Ht 60.0 in | Wt 162.0 lb

## 2020-10-19 DIAGNOSIS — F439 Reaction to severe stress, unspecified: Secondary | ICD-10-CM | POA: Diagnosis not present

## 2020-10-19 DIAGNOSIS — Z6833 Body mass index (BMI) 33.0-33.9, adult: Secondary | ICD-10-CM

## 2020-10-19 DIAGNOSIS — E669 Obesity, unspecified: Secondary | ICD-10-CM | POA: Diagnosis not present

## 2020-10-19 NOTE — Progress Notes (Signed)
Chief Complaint:   OBESITY Debra Macdonald is here to discuss her progress with her obesity treatment plan along with follow-up of her obesity related diagnoses. Debra Macdonald is on the Category 1 Plan and states she is following her eating plan approximately 94% of the time. Debra Macdonald states she is walking for 35 minutes 4 times per week.  Today's visit was #: 4 Starting weight: 174 lbs Starting date: 08/23/2020 Today's weight: 162 lbs Today's date: 10/19/2020 Total lbs lost to date: 12 Total lbs lost since last in-office visit: 1  Interim History: Debra Macdonald continues to do well with weight loss on her plan. She sometimes struggles to eat all of the food on her plan. She is getting bored with her breakfast options.  Subjective:   1. 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 She is working on taking care of herself, but her stress has increased with the possibility of early onset dementia in her husband.  Assessment/Plan:   1. Stress Debra Macdonald was offered support and encouragement. She will continue to make sure she prioritizes her physical and mental health.  2. Obesity with current BMI of 31.7 Debra Macdonald is currently in the action stage of change. As such, her goal is to continue with weight loss efforts. She has agreed to the Category 1 Plan and keeping a food journal and adhering to recommended goals of 250-350 calories and 25+ grams of protein at breakfast daily.   Exercise goals: As is.  Behavioral modification strategies: increasing lean protein intake.  Debra Macdonald has agreed to follow-up with our clinic in 2 to 3 weeks. She was informed of the importance of frequent follow-up visits to maximize her success with intensive lifestyle modifications for her multiple health conditions.   Objective:   Blood pressure 124/79, pulse 73, temperature 98.2 F (36.8 C), height 5' (1.524 m), weight 162 lb (73.5 kg), SpO2 95 %. Body mass index is 31.64 kg/m.  General: Cooperative, alert, well  developed, in no acute distress. HEENT: Conjunctivae and lids unremarkable. Cardiovascular: Regular rhythm.  Lungs: Normal work of breathing. Neurologic: No focal deficits.   Lab Results  Component Value Date   CREATININE 0.79 08/23/2020   BUN 14 08/23/2020   NA 137 08/23/2020   K 4.1 08/23/2020   CL 96 08/23/2020   CO2 22 08/23/2020   Lab Results  Component Value Date   ALT 13 08/23/2020   AST 12 08/23/2020   ALKPHOS 85 08/23/2020   BILITOT 0.5 08/23/2020   Lab Results  Component Value Date   HGBA1C 5.6 08/23/2020   Lab Results  Component Value Date   INSULIN 18.0 08/23/2020   Lab Results  Component Value Date   TSH 3.310 08/23/2020   Lab Results  Component Value Date   CHOL 184 08/23/2020   HDL 48 08/23/2020   LDLCALC 111 (H) 08/23/2020   TRIG 141 08/23/2020   Lab Results  Component Value Date   VD25OH 52.6 08/23/2020   Lab Results  Component Value Date   WBC 8.0 08/23/2020   HGB 14.4 08/23/2020   HCT 45.3 08/23/2020   MCV 92 08/23/2020   PLT 176 08/23/2020   No results found for: IRON, TIBC, FERRITIN  Attestation Statements:   Reviewed by clinician on day of visit: allergies, medications, problem list, medical history, surgical history, family history, social history, and previous encounter notes.  Time spent on visit including pre-visit chart review and post-visit care and charting was 33 minutes.    Wilhemena Durie,  am acting as transcriptionist for Dennard Nip, MD.  I have reviewed the above documentation for accuracy and completeness, and I agree with the above. -  Dennard Nip, MD

## 2020-11-10 ENCOUNTER — Encounter (INDEPENDENT_AMBULATORY_CARE_PROVIDER_SITE_OTHER): Payer: Self-pay | Admitting: Family Medicine

## 2020-11-10 ENCOUNTER — Ambulatory Visit (INDEPENDENT_AMBULATORY_CARE_PROVIDER_SITE_OTHER): Payer: Medicare Other | Admitting: Family Medicine

## 2020-11-10 ENCOUNTER — Other Ambulatory Visit: Payer: Self-pay

## 2020-11-10 VITALS — BP 113/71 | HR 73 | Temp 98.0°F | Ht 60.0 in | Wt 160.0 lb

## 2020-11-10 DIAGNOSIS — E7849 Other hyperlipidemia: Secondary | ICD-10-CM

## 2020-11-10 DIAGNOSIS — Z6833 Body mass index (BMI) 33.0-33.9, adult: Secondary | ICD-10-CM | POA: Diagnosis not present

## 2020-11-10 DIAGNOSIS — F418 Other specified anxiety disorders: Secondary | ICD-10-CM

## 2020-11-10 DIAGNOSIS — R5383 Other fatigue: Secondary | ICD-10-CM

## 2020-11-10 DIAGNOSIS — E669 Obesity, unspecified: Secondary | ICD-10-CM

## 2020-11-10 MED ORDER — ROSUVASTATIN CALCIUM 5 MG PO TABS: ORAL_TABLET | ORAL | 0 refills | Status: DC

## 2020-11-10 NOTE — Progress Notes (Signed)
Chief Complaint:   OBESITY Debra Macdonald is here to discuss her progress with her obesity treatment plan along with follow-up of her obesity related diagnoses. Debra Macdonald is on the Category 1 Plan and states she is following her eating plan approximately 90% of the time. Debra Macdonald states she is walking for 60 minutes 3-4 times per week.  Today's visit was #: 5 Starting weight: 174 lbs Starting date: 08/23/2020 Today's weight: 160 lbs Today's date: 11/10/2020 Total lbs lost to date: 14 Total lbs lost since last in-office visit: 2  Interim History: Debra Macdonald has had a lot of challenges with weight loss. Her stress level is very high and she has had a few more deviations from her plan.  Subjective:   1. Caregiver with fatigue Debra Macdonald's husband was diagnosed with dementia which has rapidly deteriorated. She is his sole caregiver and she is struggling.  2. Other hyperlipidemia Debra Macdonald is stable on Crestor, and she needs a refill today. She is working on weight loss.  3. Depression with anxiety Debra Macdonald is on Wellbutrin and rarely takes Debra Macdonald, but has had more often recently. She recognizes this is a situational depression.  Assessment/Plan:   1. Caregiver with fatigue We discussed ways to get help from the [place] and the importance of caring for herself so that she can help him better. We will follow up at her next visit.  2. Other hyperlipidemia Cardiovascular risk and specific lipid/LDL goals reviewed. We discussed several lifestyle modifications today. See will continue to work on diet, exercise and Crestor, and we will refill for 1 month. Orders and follow up as documented in patient record.   - rosuvastatin (CRESTOR) 5 MG tablet; 1 po every M, W, F night b-4 bed  Dispense: 30 tablet; Refill: 0  3. Depression with anxiety Behavior modification techniques were discussed today to help Debra Macdonald deal with her depression and anxiety. Debra Macdonald will continue Wellbutrin and will continue to  monitor. She was offered support and encouragement. Orders and follow up as documented in patient record.    4. Obesity with current BMI of 31.7 Debra Macdonald is currently in the action stage of change. As such, her goal is to continue with weight loss efforts. She has agreed to the Category 1 Plan.   Exercise goals: As is.  Behavioral modification strategies: increasing lean protein intake and emotional eating strategies.  Debra Macdonald has agreed to follow-up with our clinic in 2 to 3 weeks. She was informed of the importance of frequent follow-up visits to maximize her success with intensive lifestyle modifications for her multiple health conditions.   Objective:   Blood pressure 113/71, pulse 73, temperature 98 F (36.7 C), height 5' (1.524 m), weight 160 lb (72.6 kg). Body mass index is 31.25 kg/m.  General: Cooperative, alert, well developed, in no acute distress. HEENT: Conjunctivae and lids unremarkable. Cardiovascular: Regular rhythm.  Lungs: Normal work of breathing. Neurologic: No focal deficits.   Lab Results  Component Value Date   CREATININE 0.79 08/23/2020   BUN 14 08/23/2020   NA 137 08/23/2020   K 4.1 08/23/2020   CL 96 08/23/2020   CO2 22 08/23/2020   Lab Results  Component Value Date   ALT 13 08/23/2020   AST 12 08/23/2020   ALKPHOS 85 08/23/2020   BILITOT 0.5 08/23/2020   Lab Results  Component Value Date   HGBA1C 5.6 08/23/2020   Lab Results  Component Value Date   INSULIN 18.0 08/23/2020   Lab Results  Component Value Date  TSH 3.310 08/23/2020   Lab Results  Component Value Date   CHOL 184 08/23/2020   HDL 48 08/23/2020   LDLCALC 111 (H) 08/23/2020   TRIG 141 08/23/2020   Lab Results  Component Value Date   VD25OH 52.6 08/23/2020   Lab Results  Component Value Date   WBC 8.0 08/23/2020   HGB 14.4 08/23/2020   HCT 45.3 08/23/2020   MCV 92 08/23/2020   PLT 176 08/23/2020   No results found for: IRON, TIBC, FERRITIN  Obesity Behavioral  Intervention:   Approximately 15 minutes were spent on the discussion below.  ASK: We discussed the diagnosis of obesity with Debra Macdonald today and Debra Macdonald agreed to give [name] permission to discuss obesity behavioral modification therapy today.  ASSESS: Debra Macdonald has the diagnosis of obesity and her BMI today is 31.4. Debra Macdonald is in the action stage of change.   ADVISE: Debra Macdonald was educated on the multiple health risks of obesity as well as the benefit of weight loss to improve her health. She was advised of the need for long term treatment and the importance of lifestyle modifications to improve her current health and to decrease her risk of future health problems.  AGREE: Multiple dietary modification options and treatment options were discussed and Debra Macdonald agreed to follow the recommendations documented in the above note.  ARRANGE: Debra Macdonald was educated on the importance of frequent visits to treat obesity as outlined per CMS and USPSTF guidelines and agreed to schedule her next follow up appointment today.  Attestation Statements:   Reviewed by clinician on day of visit: allergies, medications, problem list, medical history, surgical history, family history, social history, and previous encounter notes.   I, Trixie Dredge, am acting as transcriptionist for Dennard Nip, MD.  I have reviewed the above documentation for accuracy and completeness, and I agree with the above. -  Dennard Nip, MD

## 2020-11-18 DIAGNOSIS — Z1231 Encounter for screening mammogram for malignant neoplasm of breast: Secondary | ICD-10-CM | POA: Diagnosis not present

## 2020-11-29 ENCOUNTER — Other Ambulatory Visit: Payer: Self-pay

## 2020-11-29 ENCOUNTER — Ambulatory Visit (INDEPENDENT_AMBULATORY_CARE_PROVIDER_SITE_OTHER): Payer: Medicare Other | Admitting: Family Medicine

## 2020-11-29 ENCOUNTER — Encounter (INDEPENDENT_AMBULATORY_CARE_PROVIDER_SITE_OTHER): Payer: Self-pay | Admitting: Family Medicine

## 2020-11-29 VITALS — BP 129/66 | HR 77 | Temp 98.1°F | Ht 60.0 in | Wt 158.0 lb

## 2020-11-29 DIAGNOSIS — E88819 Insulin resistance, unspecified: Secondary | ICD-10-CM

## 2020-11-29 DIAGNOSIS — E669 Obesity, unspecified: Secondary | ICD-10-CM | POA: Diagnosis not present

## 2020-11-29 DIAGNOSIS — R5383 Other fatigue: Secondary | ICD-10-CM

## 2020-11-29 DIAGNOSIS — E7849 Other hyperlipidemia: Secondary | ICD-10-CM | POA: Diagnosis not present

## 2020-11-29 DIAGNOSIS — E8881 Metabolic syndrome: Secondary | ICD-10-CM | POA: Diagnosis not present

## 2020-11-29 DIAGNOSIS — Z6833 Body mass index (BMI) 33.0-33.9, adult: Secondary | ICD-10-CM

## 2020-11-29 DIAGNOSIS — Z9189 Other specified personal risk factors, not elsewhere classified: Secondary | ICD-10-CM | POA: Diagnosis not present

## 2020-11-29 MED ORDER — ROSUVASTATIN CALCIUM 5 MG PO TABS: ORAL_TABLET | ORAL | 0 refills | Status: DC

## 2020-11-29 NOTE — Progress Notes (Signed)
Chief Complaint:   OBESITY Debra Macdonald is here to discuss her progress with her obesity treatment plan along with follow-up of her obesity related diagnoses. Debra Macdonald is on the Category 1 Plan and states she is following her eating plan approximately 89-90% of the time. Debra Macdonald states she is doing 0 minutes 0 times per week.  Today's visit was #: 6 Starting weight: 174 lbs Starting date: 08/23/2020 Today's weight: 158 lbs Today's date: 11/29/2020 Total lbs lost to date: 16 Total lbs lost since last in-office visit: 2  Interim History: Debra Macdonald continues to work on weight loss. She has a lot of challenges and she has deviated from her plan at times, but overall she is doing very well.  Subjective:   1. Insulin resistance Debra Macdonald is working on diet and weight loss. She is doing well with decreasing simple carbohydrates. She is not on metformin.   2. Other hyperlipidemia Debra Macdonald is stable on Crestor. She has decreased her saturated fats in her diet and decreased her cholesterol intake.  3. Caregiver with fatigue Debra Macdonald is caring for her husband who has dementia. She does his care and transfers, bathing, and helping him to the toilet. She has 15 hours of [facility] caregiver per week but she is still exhausted.   4. At risk for diabetes mellitus Debra Macdonald is at higher than average risk for developing diabetes due to obesity.   Assessment/Plan:   1. Insulin resistance Debra Macdonald will continue with diet, exercise, and decreasing simple carbohydrates to help decrease the risk of diabetes. We will recheck labs in 3-4 weeks. Debra Macdonald agreed to follow-up with [name] as directed to closely monitor her progress.  2. Other hyperlipidemia Cardiovascular risk and specific lipid/LDL goals reviewed. We discussed several lifestyle modifications today. We will refill Crestor 5 mg q daily #30 for 1 month. Debra Macdonald will continue to work on diet, exercise and weight loss efforts. Orders and follow up as documented in  patient record.   3. Caregiver with fatigue Debra Macdonald was advised to increase the caregiver's hours to 20 hours per week through the [place], and try to learn better patient transfer techniques to avoid injury to herself or to her husband.  4. At risk for diabetes mellitus Debra Macdonald was given approximately 15 minutes of diabetes education and counseling today. We discussed intensive lifestyle modifications today with an emphasis on weight loss as well as increasing exercise and decreasing simple carbohydrates in her diet. We also reviewed medication options with an emphasis on risk versus benefit of those discussed.   Repetitive spaced learning was employed today to elicit superior memory formation and behavioral change.  5. Obesity with current BMI of 30.9 Debra Macdonald is currently in the action stage of change. As such, her goal is to continue with weight loss efforts. She has agreed to the Category 1 Plan.   We will recheck fasting labs at her next visit.  Behavioral modification strategies: no skipping meals and emotional eating strategies.  Debra Macdonald has agreed to follow-up with our clinic in 3 weeks. She was informed of the importance of frequent follow-up visits to maximize her success with intensive lifestyle modifications for her multiple health conditions.   Objective:   Blood pressure 129/66, pulse 77, temperature 98.1 F (36.7 C), height 5' (1.524 m), weight 158 lb (71.7 kg), SpO2 96 %. Body mass index is 30.86 kg/m.  General: Cooperative, alert, well developed, in no acute distress. HEENT: Conjunctivae and lids unremarkable. Cardiovascular: Regular rhythm.  Lungs: Normal work of breathing. Neurologic: No  focal deficits.   Lab Results  Component Value Date   CREATININE 0.79 08/23/2020   BUN 14 08/23/2020   NA 137 08/23/2020   K 4.1 08/23/2020   CL 96 08/23/2020   CO2 22 08/23/2020   Lab Results  Component Value Date   ALT 13 08/23/2020   AST 12 08/23/2020   ALKPHOS 85  08/23/2020   BILITOT 0.5 08/23/2020   Lab Results  Component Value Date   HGBA1C 5.6 08/23/2020   Lab Results  Component Value Date   INSULIN 18.0 08/23/2020   Lab Results  Component Value Date   TSH 3.310 08/23/2020   Lab Results  Component Value Date   CHOL 184 08/23/2020   HDL 48 08/23/2020   LDLCALC 111 (H) 08/23/2020   TRIG 141 08/23/2020   Lab Results  Component Value Date   VD25OH 52.6 08/23/2020   Lab Results  Component Value Date   WBC 8.0 08/23/2020   HGB 14.4 08/23/2020   HCT 45.3 08/23/2020   MCV 92 08/23/2020   PLT 176 08/23/2020   No results found for: IRON, TIBC, FERRITIN  Attestation Statements:   Reviewed by clinician on day of visit: allergies, medications, problem list, medical history, surgical history, family history, social history, and previous encounter notes.   I, Trixie Dredge, am acting as transcriptionist for Dennard Nip, MD.  I have reviewed the above documentation for accuracy and completeness, and I agree with the above. -  Dennard Nip, MD

## 2020-12-07 DIAGNOSIS — R921 Mammographic calcification found on diagnostic imaging of breast: Secondary | ICD-10-CM | POA: Diagnosis not present

## 2020-12-07 DIAGNOSIS — R922 Inconclusive mammogram: Secondary | ICD-10-CM | POA: Diagnosis not present

## 2020-12-07 DIAGNOSIS — R928 Other abnormal and inconclusive findings on diagnostic imaging of breast: Secondary | ICD-10-CM | POA: Diagnosis not present

## 2020-12-13 ENCOUNTER — Ambulatory Visit (INDEPENDENT_AMBULATORY_CARE_PROVIDER_SITE_OTHER): Payer: Medicare Other | Admitting: Family Medicine

## 2020-12-21 ENCOUNTER — Other Ambulatory Visit: Payer: Self-pay | Admitting: Radiology

## 2020-12-21 DIAGNOSIS — R921 Mammographic calcification found on diagnostic imaging of breast: Secondary | ICD-10-CM | POA: Diagnosis not present

## 2020-12-21 DIAGNOSIS — N6315 Unspecified lump in the right breast, overlapping quadrants: Secondary | ICD-10-CM | POA: Diagnosis not present

## 2020-12-26 ENCOUNTER — Encounter (INDEPENDENT_AMBULATORY_CARE_PROVIDER_SITE_OTHER): Payer: Self-pay | Admitting: Family Medicine

## 2020-12-26 ENCOUNTER — Ambulatory Visit (INDEPENDENT_AMBULATORY_CARE_PROVIDER_SITE_OTHER): Payer: Medicare Other | Admitting: Family Medicine

## 2020-12-26 ENCOUNTER — Other Ambulatory Visit: Payer: Self-pay

## 2020-12-26 VITALS — BP 130/59 | HR 68 | Temp 97.6°F | Ht 60.0 in | Wt 159.0 lb

## 2020-12-26 DIAGNOSIS — F418 Other specified anxiety disorders: Secondary | ICD-10-CM | POA: Diagnosis not present

## 2020-12-26 DIAGNOSIS — E7849 Other hyperlipidemia: Secondary | ICD-10-CM

## 2020-12-26 DIAGNOSIS — E669 Obesity, unspecified: Secondary | ICD-10-CM | POA: Diagnosis not present

## 2020-12-26 DIAGNOSIS — Z6833 Body mass index (BMI) 33.0-33.9, adult: Secondary | ICD-10-CM

## 2020-12-26 DIAGNOSIS — Z9189 Other specified personal risk factors, not elsewhere classified: Secondary | ICD-10-CM | POA: Diagnosis not present

## 2020-12-26 MED ORDER — ROSUVASTATIN CALCIUM 5 MG PO TABS: ORAL_TABLET | ORAL | 0 refills | Status: DC

## 2020-12-26 NOTE — Progress Notes (Signed)
Chief Complaint:   OBESITY Debra Macdonald is here to discuss her progress with her obesity treatment plan along with follow-up of her obesity related diagnoses. Debra Macdonald is on the Category 1 Plan and states she is following her eating plan approximately 85% of the time. Debra Macdonald states she is doing 0 minutes 0 times per week.  Today's visit was #: 7 Starting weight: 174 lbs Starting date: 08/23/2020 Today's weight: 159 lbs Today's date: 12/26/2020 Total lbs lost to date: 15 Total lbs lost since last in-office visit: 0  Interim History: Debra Macdonald has had a lot of stressors with her husband being hospitalized. She has not been able to concentrate on her own health including eating, planning, and prepping.  Subjective:   1. Other hyperlipidemia Debra Macdonald is stable on Crestor, and no chest pain or myalgias were noted.  2. Depression with anxiety Debra Macdonald is on Wellbutrin and Xanax. She is caring for her husband with dementia.  3. At risk for heart disease Debra Macdonald is at a higher than average risk for cardiovascular disease due to obesity.   Assessment/Plan:   1. Other hyperlipidemia Cardiovascular risk and specific lipid/LDL goals reviewed. We discussed several lifestyle modifications today. We will refill Crestor for 1 month. Debra Macdonald will continue to work on diet, exercise and weight loss efforts. Orders and follow up as documented in patient record.   - rosuvastatin (CRESTOR) 5 MG tablet; 1 po every M, W, F night b-4 bed  Dispense: 30 tablet; Refill: 0  2. Depression with anxiety Behavior modification techniques were discussed today to help Debra Macdonald deal with her emotional/non-hunger eating behaviors. Debra Macdonald will continue her medications, and we will follow up at her next visit. Orders and follow up as documented in patient record.   3. At risk for heart disease Debra Macdonald was given approximately 15 minutes of coronary artery disease prevention counseling today. She is 65 y.o. female and has  risk factors for heart disease including obesity. We discussed intensive lifestyle modifications today with an emphasis on specific weight loss instructions and strategies.   Repetitive spaced learning was employed today to elicit superior memory formation and behavioral change.  4. Obesity with current BMI of 31.1 Debra Macdonald is currently in the action stage of change. As such, her goal is to maintain weight for now over the holidays. She has agreed to the Category 1 Plan.   Behavioral modification strategies: no skipping meals, emotional eating strategies, holiday eating strategies , and planning for success.  Debra Macdonald has agreed to follow-up with our clinic in 3 to 4 weeks. She was informed of the importance of frequent follow-up visits to maximize her success with intensive lifestyle modifications for her multiple health conditions.   Objective:   Blood pressure (!) 130/59, pulse 68, temperature 97.6 F (36.4 C), height 5' (1.524 m), weight 159 lb (72.1 kg), SpO2 97 %. Body mass index is 31.05 kg/m.  General: Cooperative, alert, well developed, in no acute distress. HEENT: Conjunctivae and lids unremarkable. Cardiovascular: Regular rhythm.  Lungs: Normal work of breathing. Neurologic: No focal deficits.   Lab Results  Component Value Date   CREATININE 0.79 08/23/2020   BUN 14 08/23/2020   NA 137 08/23/2020   K 4.1 08/23/2020   CL 96 08/23/2020   CO2 22 08/23/2020   Lab Results  Component Value Date   ALT 13 08/23/2020   AST 12 08/23/2020   ALKPHOS 85 08/23/2020   BILITOT 0.5 08/23/2020   Lab Results  Component Value Date   HGBA1C  5.6 08/23/2020   Lab Results  Component Value Date   INSULIN 18.0 08/23/2020   Lab Results  Component Value Date   TSH 3.310 08/23/2020   Lab Results  Component Value Date   CHOL 184 08/23/2020   HDL 48 08/23/2020   LDLCALC 111 (H) 08/23/2020   TRIG 141 08/23/2020   Lab Results  Component Value Date   VD25OH 52.6 08/23/2020   Lab  Results  Component Value Date   WBC 8.0 08/23/2020   HGB 14.4 08/23/2020   HCT 45.3 08/23/2020   MCV 92 08/23/2020   PLT 176 08/23/2020   No results found for: IRON, TIBC, FERRITIN  Attestation Statements:   Reviewed by clinician on day of visit: allergies, medications, problem list, medical history, surgical history, family history, social history, and previous encounter notes.   I, Trixie Dredge, am acting as transcriptionist for Dennard Nip, MD.  I have reviewed the above documentation for accuracy and completeness, and I agree with the above. -  Dennard Nip, MD

## 2020-12-27 ENCOUNTER — Other Ambulatory Visit (INDEPENDENT_AMBULATORY_CARE_PROVIDER_SITE_OTHER): Payer: Self-pay | Admitting: Family Medicine

## 2020-12-27 DIAGNOSIS — E7849 Other hyperlipidemia: Secondary | ICD-10-CM

## 2021-01-16 ENCOUNTER — Encounter (INDEPENDENT_AMBULATORY_CARE_PROVIDER_SITE_OTHER): Payer: Self-pay | Admitting: Family Medicine

## 2021-01-16 ENCOUNTER — Other Ambulatory Visit: Payer: Self-pay

## 2021-01-16 ENCOUNTER — Ambulatory Visit (INDEPENDENT_AMBULATORY_CARE_PROVIDER_SITE_OTHER): Payer: Medicare Other | Admitting: Family Medicine

## 2021-01-16 VITALS — BP 132/74 | HR 72 | Temp 98.5°F | Ht 60.0 in | Wt 156.0 lb

## 2021-01-16 DIAGNOSIS — Z6833 Body mass index (BMI) 33.0-33.9, adult: Secondary | ICD-10-CM

## 2021-01-16 DIAGNOSIS — E7849 Other hyperlipidemia: Secondary | ICD-10-CM

## 2021-01-16 DIAGNOSIS — E669 Obesity, unspecified: Secondary | ICD-10-CM | POA: Diagnosis not present

## 2021-01-16 DIAGNOSIS — R7309 Other abnormal glucose: Secondary | ICD-10-CM | POA: Diagnosis not present

## 2021-01-16 DIAGNOSIS — E8881 Metabolic syndrome: Secondary | ICD-10-CM

## 2021-01-16 DIAGNOSIS — E538 Deficiency of other specified B group vitamins: Secondary | ICD-10-CM | POA: Diagnosis not present

## 2021-01-16 DIAGNOSIS — E559 Vitamin D deficiency, unspecified: Secondary | ICD-10-CM

## 2021-01-16 NOTE — Progress Notes (Signed)
Chief Complaint:   OBESITY Debra Macdonald is here to discuss her progress with her obesity treatment plan along with follow-up of her obesity related diagnoses. Debra Macdonald is on the Category 1 Plan and states she is following her eating plan approximately 90% of the time. Debra Macdonald states she is doing every day activity.  Today's visit was #: 8 Starting weight: 174 lbs Starting date: 08/23/2020 Today's weight: 156 lbs Today's date: 01/16/2021 Total lbs lost to date: 18 Total lbs lost since last in-office visit: 3  Interim History: Debra Macdonald has done very well avoiding holiday weight gain, and she has continued with weight loss. She has been dealing with a lot of stress with her husband's health which makes her weight loss even more impressive.  Subjective:   1. Other hyperlipidemia Debra Macdonald is working on diet and weight loss, and she is due for labs. No side effects were noted on fish oil and Crestor.  2. Vitamin D deficiency Debra Macdonald is stable on Vit D, and she is on multivitamins currently.  3. B12 deficiency Debra Macdonald is on multivitamins, and she is on a B12 rich diet currently.  4. Insulin resistance Debra Macdonald is doing well decreasing simple carbohydrates in her diet. She is due to have labs checked.  Assessment/Plan:   1. Other hyperlipidemia Cardiovascular risk and specific lipid/LDL goals reviewed. We discussed several lifestyle modifications today. We will check labs today. Debra Macdonald will continue to work on diet, exercise and weight loss efforts. Orders and follow up as documented in patient record.   2. Vitamin D deficiency Low Vitamin D level contributes to fatigue and are associated with obesity, breast, and colon cancer. We will check labs today. Debra Macdonald will follow-up for routine testing of Vitamin D, at least 2-3 times per year to avoid over-replacement.  - VITAMIN D 25 Hydroxy (Vit-D Deficiency, Fractures)  3. B12 deficiency The diagnosis was reviewed with the patient. We will  check labs today. Orders and follow up as documented in patient record.  - Vitamin B12  4. Insulin resistance Debra Macdonald will continue to work on weight loss, exercise, and decreasing simple carbohydrates to help decrease the risk of diabetes. We will check labs today. Debra Macdonald agreed to follow-up with [name] as directed to closely monitor her progress.  - CMP14+EGFR - Insulin, random - Hemoglobin A1c  5. Obesity BMI today is 75 Debra Macdonald is currently in the action stage of change. As such, her goal is to continue with weight loss efforts. She has agreed to the Category 1 Plan.   Exercise goals: As is.  Behavioral modification strategies: increasing lean protein intake and emotional eating strategies.  Debra Macdonald has agreed to follow-up with our clinic in 4 weeks. She was informed of the importance of frequent follow-up visits to maximize her success with intensive lifestyle modifications for her multiple health conditions.   Debra Macdonald was informed we would discuss her lab results at her next visit unless there is a critical issue that needs to be addressed sooner. Debra Macdonald agreed to keep her next visit at the agreed upon time to discuss these results.  Objective:   Blood pressure 132/74, pulse 72, temperature 98.5 F (36.9 C), height 5' (1.524 m), weight 156 lb (70.8 kg), SpO2 97 %. Body mass index is 30.47 kg/m.  General: Cooperative, alert, well developed, in no acute distress. HEENT: Conjunctivae and lids unremarkable. Cardiovascular: Regular rhythm.  Lungs: Normal work of breathing. Neurologic: No focal deficits.   Lab Results  Component Value Date   CREATININE 0.79  08/23/2020   BUN 14 08/23/2020   NA 137 08/23/2020   K 4.1 08/23/2020   CL 96 08/23/2020   CO2 22 08/23/2020   Lab Results  Component Value Date   ALT 13 08/23/2020   AST 12 08/23/2020   ALKPHOS 85 08/23/2020   BILITOT 0.5 08/23/2020   Lab Results  Component Value Date   HGBA1C 5.6 08/23/2020   Lab Results   Component Value Date   INSULIN 18.0 08/23/2020   Lab Results  Component Value Date   TSH 3.310 08/23/2020   Lab Results  Component Value Date   CHOL 184 08/23/2020   HDL 48 08/23/2020   LDLCALC 111 (H) 08/23/2020   TRIG 141 08/23/2020   Lab Results  Component Value Date   VD25OH 52.6 08/23/2020   Lab Results  Component Value Date   WBC 8.0 08/23/2020   HGB 14.4 08/23/2020   HCT 45.3 08/23/2020   MCV 92 08/23/2020   PLT 176 08/23/2020   No results found for: IRON, TIBC, FERRITIN  Obesity Behavioral Intervention:   Approximately 15 minutes were spent on the discussion below.  ASK: We discussed the diagnosis of obesity with Debra Macdonald today and Debra Macdonald agreed to give [name] permission to discuss obesity behavioral modification therapy today.  ASSESS: Debra Macdonald has the diagnosis of obesity and her BMI today is 30.5. Debra Macdonald is in the action stage of change.   ADVISE: Debra Macdonald was educated on the multiple health risks of obesity as well as the benefit of weight loss to improve her health. She was advised of the need for long term treatment and the importance of lifestyle modifications to improve her current health and to decrease her risk of future health problems.  AGREE: Multiple dietary modification options and treatment options were discussed and Debra Macdonald agreed to follow the recommendations documented in the above note.  ARRANGE: Debra Macdonald was educated on the importance of frequent visits to treat obesity as outlined per CMS and USPSTF guidelines and agreed to schedule her next follow up appointment today.  Attestation Statements:   Reviewed by clinician on day of visit: allergies, medications, problem list, medical history, surgical history, family history, social history, and previous encounter notes.   I, Trixie Dredge, am acting as transcriptionist for Dennard Nip, MD.  I have reviewed the above documentation for accuracy and completeness, and I agree with the above.  -  Dennard Nip, MD

## 2021-01-17 LAB — INSULIN, RANDOM: INSULIN: 8.4 u[IU]/mL (ref 2.6–24.9)

## 2021-01-17 LAB — CMP14+EGFR
ALT: 12 IU/L (ref 0–32)
AST: 17 IU/L (ref 0–40)
Albumin/Globulin Ratio: 1.6 (ref 1.2–2.2)
Albumin: 4.6 g/dL (ref 3.8–4.8)
Alkaline Phosphatase: 85 IU/L (ref 44–121)
BUN/Creatinine Ratio: 11 — ABNORMAL LOW (ref 12–28)
BUN: 11 mg/dL (ref 8–27)
Bilirubin Total: 0.7 mg/dL (ref 0.0–1.2)
CO2: 25 mmol/L (ref 20–29)
Calcium: 9.9 mg/dL (ref 8.7–10.3)
Chloride: 98 mmol/L (ref 96–106)
Creatinine, Ser: 0.96 mg/dL (ref 0.57–1.00)
Globulin, Total: 2.8 g/dL (ref 1.5–4.5)
Glucose: 77 mg/dL (ref 70–99)
Potassium: 4.5 mmol/L (ref 3.5–5.2)
Sodium: 139 mmol/L (ref 134–144)
Total Protein: 7.4 g/dL (ref 6.0–8.5)
eGFR: 65 mL/min/{1.73_m2} (ref >59)

## 2021-01-17 LAB — HEMOGLOBIN A1C
Est. average glucose Bld gHb Est-mCnc: 114 mg/dL
Hgb A1c MFr Bld: 5.6 % (ref 4.8–5.6)

## 2021-01-17 LAB — VITAMIN D 25 HYDROXY (VIT D DEFICIENCY, FRACTURES): Vit D, 25-Hydroxy: 60.7 ng/mL (ref 30.0–100.0)

## 2021-01-17 LAB — VITAMIN B12: Vitamin B-12: 796 pg/mL (ref 232–1245)

## 2021-02-14 ENCOUNTER — Ambulatory Visit (INDEPENDENT_AMBULATORY_CARE_PROVIDER_SITE_OTHER): Payer: Medicare Other | Admitting: Family Medicine

## 2021-02-14 ENCOUNTER — Other Ambulatory Visit: Payer: Self-pay

## 2021-02-14 ENCOUNTER — Encounter (INDEPENDENT_AMBULATORY_CARE_PROVIDER_SITE_OTHER): Payer: Self-pay | Admitting: Family Medicine

## 2021-02-14 VITALS — BP 132/79 | HR 75 | Temp 98.1°F | Ht 60.0 in | Wt 161.0 lb

## 2021-02-14 DIAGNOSIS — E559 Vitamin D deficiency, unspecified: Secondary | ICD-10-CM | POA: Diagnosis not present

## 2021-02-14 DIAGNOSIS — E88819 Insulin resistance, unspecified: Secondary | ICD-10-CM

## 2021-02-14 DIAGNOSIS — E8881 Metabolic syndrome: Secondary | ICD-10-CM

## 2021-02-14 DIAGNOSIS — E669 Obesity, unspecified: Secondary | ICD-10-CM | POA: Diagnosis not present

## 2021-02-14 DIAGNOSIS — Z6833 Body mass index (BMI) 33.0-33.9, adult: Secondary | ICD-10-CM

## 2021-02-14 DIAGNOSIS — E538 Deficiency of other specified B group vitamins: Secondary | ICD-10-CM | POA: Diagnosis not present

## 2021-02-15 NOTE — Progress Notes (Signed)
Chief Complaint:   OBESITY Debra Macdonald is here to discuss her progress with her obesity treatment plan along with follow-up of her obesity related diagnoses. Debra Macdonald is on the Category 1 Plan and states she is following her eating plan approximately 70% of the time. Debra Macdonald states she is walking 2 times per week.   Today's visit was #: 9 Starting weight: 174 lbs Starting date: 08/23/2020 Today's weight: 161 lbs Today's date: 02/14/2021 Total lbs lost to date: 13 Total lbs lost since last in-office visit: 0  Interim History: [name] did some celebrations eating over the holidays, but she has already gotten back on track. She is open to journaling as a Category 1 plan alternative.   Subjective:   1. Insulin resistance Debra Macdonald continues to do well with diet and exercise, and outside of some holiday indulgences she has done well with decreasing simple carbohydrates. Her fasting insulin has improved. I discussed labs wit the patient today.  2. B12 deficiency Debra Macdonald's B12 level is now at goal. She is on a B12 rich diet in addition to her multivitamins. I discussed labs wit the patient today.  3. Vitamin D deficiency Debra Macdonald's Vit D level is at goal on her multivitamins, even in the Winter which is partially due to her weight loss. I discussed labs wit the patient today.  Assessment/Plan:   1. Insulin resistance Debra Macdonald will continue with diet, exercise, and decreasing simple carbohydrates to help decrease the risk of diabetes. Debra Macdonald agreed to follow-up with Debra Macdonald as directed to closely monitor her progress.  2. B12 deficiency The diagnosis was reviewed with the patient. Debra Macdonald will continue her multivitamins OTC, and will continue to follow up as directed. Orders and follow up as documented in patient record.  3. Vitamin D deficiency Debra Macdonald will continue her multivitamins, diet and exercise. She will follow-up for routine testing of Vitamin D, at least 2-3 times per year to avoid  over-replacement.  4. Obesity with current BMI of 31.5 Debra Macdonald is currently in the action stage of change. As such, her goal is to continue with weight loss efforts. She has agreed to keeping a food journal and adhering to recommended goals of 1000-1200 calories and 70+ grams of protein daily.   Exercise goals: As is.  Behavioral modification strategies: increasing lean protein intake and meal planning and cooking strategies.  Debra Macdonald has agreed to follow-up with our clinic in 3 weeks. She was informed of the importance of frequent follow-up visits to maximize her success with intensive lifestyle modifications for her multiple health conditions.   Objective:   Blood pressure 132/79, pulse 75, temperature 98.1 F (36.7 C), temperature source Oral, height 5' (1.524 m), weight 161 lb (73 kg), SpO2 97 %. Body mass index is 31.44 kg/m.  General: Cooperative, alert, well developed, in no acute distress. HEENT: Conjunctivae and lids unremarkable. Cardiovascular: Regular rhythm.  Lungs: Normal work of breathing. Neurologic: No focal deficits.   Lab Results  Component Value Date   CREATININE 0.96 01/16/2021   BUN 11 01/16/2021   NA 139 01/16/2021   K 4.5 01/16/2021   CL 98 01/16/2021   CO2 25 01/16/2021   Lab Results  Component Value Date   ALT 12 01/16/2021   AST 17 01/16/2021   ALKPHOS 85 01/16/2021   BILITOT 0.7 01/16/2021   Lab Results  Component Value Date   HGBA1C 5.6 01/16/2021   HGBA1C 5.6 08/23/2020   Lab Results  Component Value Date   INSULIN 8.4 01/16/2021  INSULIN 18.0 08/23/2020   Lab Results  Component Value Date   TSH 3.310 08/23/2020   Lab Results  Component Value Date   CHOL 184 08/23/2020   HDL 48 08/23/2020   LDLCALC 111 (H) 08/23/2020   TRIG 141 08/23/2020   Lab Results  Component Value Date   VD25OH 60.7 01/16/2021   VD25OH 52.6 08/23/2020   Lab Results  Component Value Date   WBC 8.0 08/23/2020   HGB 14.4 08/23/2020   HCT 45.3  08/23/2020   MCV 92 08/23/2020   PLT 176 08/23/2020   No results found for: IRON, TIBC, FERRITIN  Attestation Statements:   Reviewed by clinician on day of visit: allergies, medications, problem list, medical history, surgical history, family history, social history, and previous encounter notes.  Time spent on visit including pre-visit chart review and post-visit care and charting was 39 minutes.    I, Trixie Dredge, am acting as transcriptionist for Dennard Nip, MD.  I have reviewed the above documentation for accuracy and completeness, and I agree with the above. -  Dennard Nip, MD

## 2021-02-18 ENCOUNTER — Other Ambulatory Visit (INDEPENDENT_AMBULATORY_CARE_PROVIDER_SITE_OTHER): Payer: Self-pay | Admitting: Family Medicine

## 2021-02-20 NOTE — Telephone Encounter (Signed)
LAST APPOINTMENT DATE: 02/14/21 NEXT APPOINTMENT DATE: 03/15/21   [id number] - [place], [place] - [address] [place] [address] Phone: [phone] Fax: [phone]  [id number] - [place], [address] [place] [address] Phone: [phone] Fax: [phone]  Patient is requesting a refill of the following medications: Requested Prescriptions   Pending Prescriptions Disp Refills   buPROPion (WELLBUTRIN XL) 300 MG 24 hr tablet [Pharmacy Med Name: BUPROPION HCL XL 300 MG TABLET] 90 tablet 1    Sig: TAKE 1 TABLET BY MOUTH EVERY DAY    Date last filled: Not known Previously prescribed by Historical Provider  Lab Results  Component Value Date   HGBA1C 5.6 01/16/2021   HGBA1C 5.6 08/23/2020   Lab Results  Component Value Date   LDLCALC 111 (H) 08/23/2020   CREATININE 0.96 01/16/2021   Lab Results  Component Value Date   VD25OH 60.7 01/16/2021   VD25OH 52.6 08/23/2020    BP Readings from Last 3 Encounters:  02/14/21 132/79  01/16/21 132/74  12/26/20 (!) 130/59

## 2021-03-08 DIAGNOSIS — Z8371 Family history of colonic polyps: Secondary | ICD-10-CM | POA: Diagnosis not present

## 2021-03-08 DIAGNOSIS — Z1211 Encounter for screening for malignant neoplasm of colon: Secondary | ICD-10-CM | POA: Diagnosis not present

## 2021-03-08 DIAGNOSIS — D125 Benign neoplasm of sigmoid colon: Secondary | ICD-10-CM | POA: Diagnosis not present

## 2021-03-08 DIAGNOSIS — K635 Polyp of colon: Secondary | ICD-10-CM | POA: Diagnosis not present

## 2021-03-10 DIAGNOSIS — K635 Polyp of colon: Secondary | ICD-10-CM | POA: Diagnosis not present

## 2021-03-15 ENCOUNTER — Ambulatory Visit (INDEPENDENT_AMBULATORY_CARE_PROVIDER_SITE_OTHER): Payer: Medicare Other | Admitting: Family Medicine

## 2021-04-10 ENCOUNTER — Encounter (INDEPENDENT_AMBULATORY_CARE_PROVIDER_SITE_OTHER): Payer: Self-pay | Admitting: Family Medicine

## 2021-04-10 ENCOUNTER — Ambulatory Visit (INDEPENDENT_AMBULATORY_CARE_PROVIDER_SITE_OTHER): Payer: Medicare Other | Admitting: Family Medicine

## 2021-04-10 ENCOUNTER — Other Ambulatory Visit: Payer: Self-pay

## 2021-04-10 VITALS — BP 117/71 | HR 73 | Temp 98.1°F | Ht 60.0 in | Wt 161.0 lb

## 2021-04-10 DIAGNOSIS — E8881 Metabolic syndrome: Secondary | ICD-10-CM

## 2021-04-10 DIAGNOSIS — E669 Obesity, unspecified: Secondary | ICD-10-CM

## 2021-04-10 DIAGNOSIS — Z6831 Body mass index (BMI) 31.0-31.9, adult: Secondary | ICD-10-CM

## 2021-04-11 NOTE — Progress Notes (Signed)
Chief Complaint:   OBESITY Debra Macdonald is here to discuss her progress with her obesity treatment plan along with follow-up of her obesity related diagnoses. Debra Macdonald is on keeping a food journal and adhering to recommended goals of 1000-1200 calories and 70+ grams of protein daily and states she is following her eating plan approximately 60% of the time. Debra Macdonald states she is walking and doing yard work for 30 minutes 2-3 times per week.  Today's visit was #: 10 Starting weight: 174 lbs Starting date: 08/23/2020 Today's weight: 161 lbs Today's date: 04/10/2021 Total lbs lost to date: 13 Total lbs lost since last in-office visit: 0  Interim History: Debra Macdonald has done well with maintaining her weight loss since her last visit. She is working on increasing her activity outside. She has been doing more portion control and smarter choices, and may not be getting enough nutrition.  Subjective:   1. Insulin resistance Debra Macdonald continues to do well with decreasing sugar and simple carbohydrates in her diet. Her polyphagia has improved. She has no signs of hyperglycemia.  Assessment/Plan:   1. Insulin resistance Debra Macdonald will continue with diet, exercise, weight loss, and decreasing simple carbohydrates to help decrease the risk of diabetes. Debra Macdonald agreed to follow-up with [name] as directed to closely monitor her progress.  2. Obesity with current BMI of 31.6 Debra Macdonald is currently in the action stage of change. As such, her goal is to continue with weight loss efforts. She has agreed to change to keeping a food journal and adhering to recommended goals of 1100-1200 calories and 70+ grams of protein daily.   Exercise goals: As is.  Behavioral modification strategies: increasing lean protein intake and no skipping meals.  Debra Macdonald has agreed to follow-up with our clinic in 3 to 4 weeks. She was informed of the importance of frequent follow-up visits to maximize her success with intensive lifestyle  modifications for her multiple health conditions.   Objective:   Blood pressure 117/71, pulse 73, temperature 98.1 F (36.7 C), height 5' (1.524 m), weight 161 lb (73 kg), SpO2 99 %. Body mass index is 31.44 kg/m.  General: Cooperative, alert, well developed, in no acute distress. HEENT: Conjunctivae and lids unremarkable. Cardiovascular: Regular rhythm.  Lungs: Normal work of breathing. Neurologic: No focal deficits.   Lab Results  Component Value Date   CREATININE 0.96 01/16/2021   BUN 11 01/16/2021   NA 139 01/16/2021   K 4.5 01/16/2021   CL 98 01/16/2021   CO2 25 01/16/2021   Lab Results  Component Value Date   ALT 12 01/16/2021   AST 17 01/16/2021   ALKPHOS 85 01/16/2021   BILITOT 0.7 01/16/2021   Lab Results  Component Value Date   HGBA1C 5.6 01/16/2021   HGBA1C 5.6 08/23/2020   Lab Results  Component Value Date   INSULIN 8.4 01/16/2021   INSULIN 18.0 08/23/2020   Lab Results  Component Value Date   TSH 3.310 08/23/2020   Lab Results  Component Value Date   CHOL 184 08/23/2020   HDL 48 08/23/2020   LDLCALC 111 (H) 08/23/2020   TRIG 141 08/23/2020   Lab Results  Component Value Date   VD25OH 60.7 01/16/2021   VD25OH 52.6 08/23/2020   Lab Results  Component Value Date   WBC 8.0 08/23/2020   HGB 14.4 08/23/2020   HCT 45.3 08/23/2020   MCV 92 08/23/2020   PLT 176 08/23/2020   No results found for: IRON, TIBC, FERRITIN  Attestation Statements:  Reviewed by clinician on day of visit: allergies, medications, problem list, medical history, surgical history, family history, social history, and previous encounter notes.   I, Trixie Dredge, am acting as transcriptionist for Dennard Nip, MD.  I have reviewed the above documentation for accuracy and completeness, and I agree with the above. -  Dennard Nip, MD

## 2021-04-23 ENCOUNTER — Other Ambulatory Visit (INDEPENDENT_AMBULATORY_CARE_PROVIDER_SITE_OTHER): Payer: Self-pay | Admitting: Family Medicine

## 2021-04-23 DIAGNOSIS — E7849 Other hyperlipidemia: Secondary | ICD-10-CM

## 2021-04-24 NOTE — Telephone Encounter (Signed)
Dr.Beasley 

## 2021-05-03 ENCOUNTER — Ambulatory Visit (INDEPENDENT_AMBULATORY_CARE_PROVIDER_SITE_OTHER): Payer: Medicare Other | Admitting: Family Medicine

## 2021-05-12 ENCOUNTER — Other Ambulatory Visit (INDEPENDENT_AMBULATORY_CARE_PROVIDER_SITE_OTHER): Payer: Self-pay | Admitting: Family Medicine

## 2021-05-12 DIAGNOSIS — E7849 Other hyperlipidemia: Secondary | ICD-10-CM

## 2021-06-05 ENCOUNTER — Ambulatory Visit (INDEPENDENT_AMBULATORY_CARE_PROVIDER_SITE_OTHER): Payer: Medicare Other | Admitting: Family Medicine

## 2021-06-16 ENCOUNTER — Other Ambulatory Visit (INDEPENDENT_AMBULATORY_CARE_PROVIDER_SITE_OTHER): Payer: Self-pay | Admitting: Family Medicine

## 2021-06-16 DIAGNOSIS — E7849 Other hyperlipidemia: Secondary | ICD-10-CM

## 2021-06-28 ENCOUNTER — Encounter (INDEPENDENT_AMBULATORY_CARE_PROVIDER_SITE_OTHER): Payer: Self-pay | Admitting: Family Medicine

## 2021-06-28 ENCOUNTER — Ambulatory Visit (INDEPENDENT_AMBULATORY_CARE_PROVIDER_SITE_OTHER): Payer: Medicare Other | Admitting: Family Medicine

## 2021-06-28 VITALS — BP 111/69 | HR 68 | Temp 97.7°F | Ht 60.0 in | Wt 165.0 lb

## 2021-06-28 DIAGNOSIS — F3289 Other specified depressive episodes: Secondary | ICD-10-CM | POA: Diagnosis not present

## 2021-06-28 DIAGNOSIS — Z6832 Body mass index (BMI) 32.0-32.9, adult: Secondary | ICD-10-CM

## 2021-06-28 DIAGNOSIS — E669 Obesity, unspecified: Secondary | ICD-10-CM

## 2021-06-28 DIAGNOSIS — F32A Depression, unspecified: Secondary | ICD-10-CM | POA: Insufficient documentation

## 2021-07-12 NOTE — Progress Notes (Signed)
Chief Complaint:   OBESITY Debra Macdonald is here to discuss her progress with her obesity treatment plan along with follow-up of her obesity related diagnoses. [name] is on keeping a food journal and adhering to recommended goals of 1100-1200 calories and 70+ grams of protein daily and states she is following her eating plan approximately 50% of the time. [name] states she is walking for 45-60 minutes 3-4 times per week.  Today's visit was #: 11 Starting weight: 174 lbs Starting date: 08/23/2020 Today's weight: 165 lbs Today's date: 06/28/2021 Total lbs lost to date: 9 Total lbs lost since last in-office visit: 0  Interim History: [name] has had a lot more stress and comfort eating. She is working on getting back on track. She has started to walk for exercise.   Subjective:   1. Other depression, with emotional eating [name] has had increased stress dealing with her husband's death. She notes increased emotional eating, worse in the evening. She is already on Wellbutrin XL 300 mg.   Assessment/Plan:   1. Other depression, with emotional eating Emotional eating behavior strategies were discussed today. [name] will work on this and we will follow up at her next visit.  2. Obesity, Current BMI 32.3 Karuna is currently in the action stage of change. As such, her goal is to continue with weight loss efforts. She has agreed to the Category 2 Plan.   Exercise goals: As is.  Behavioral modification strategies: increasing lean protein intake and emotional eating strategies.  [name] has agreed to follow-up with our clinic in 3 weeks. She was informed of the importance of frequent follow-up visits to maximize her success with intensive lifestyle modifications for her multiple health conditions.   Objective:   Blood pressure 111/69, pulse 68, temperature 97.7 F (36.5 C), height 5' (1.524 m), weight 165 lb (74.8 kg), SpO2 96 %. Body mass index is 32.22 kg/m.  General: Cooperative,  alert, well developed, in no acute distress. HEENT: Conjunctivae and lids unremarkable. Cardiovascular: Regular rhythm.  Lungs: Normal work of breathing. Neurologic: No focal deficits.   Lab Results  Component Value Date   CREATININE 0.96 01/16/2021   BUN 11 01/16/2021   NA 139 01/16/2021   K 4.5 01/16/2021   CL 98 01/16/2021   CO2 25 01/16/2021   Lab Results  Component Value Date   ALT 12 01/16/2021   AST 17 01/16/2021   ALKPHOS 85 01/16/2021   BILITOT 0.7 01/16/2021   Lab Results  Component Value Date   HGBA1C 5.6 01/16/2021   HGBA1C 5.6 08/23/2020   Lab Results  Component Value Date   INSULIN 8.4 01/16/2021   INSULIN 18.0 08/23/2020   Lab Results  Component Value Date   TSH 3.310 08/23/2020   Lab Results  Component Value Date   CHOL 184 08/23/2020   HDL 48 08/23/2020   LDLCALC 111 (H) 08/23/2020   TRIG 141 08/23/2020   Lab Results  Component Value Date   VD25OH 60.7 01/16/2021   VD25OH 52.6 08/23/2020   Lab Results  Component Value Date   WBC 8.0 08/23/2020   HGB 14.4 08/23/2020   HCT 45.3 08/23/2020   MCV 92 08/23/2020   PLT 176 08/23/2020   No results found for: IRON, TIBC, FERRITIN  Attestation Statements:   Reviewed by clinician on day of visit: allergies, medications, problem list, medical history, surgical history, family history, social history, and previous encounter notes.  Time spent on visit including pre-visit chart review and post-visit care and  charting was 35 minutes.   I, Trixie Dredge, am acting as transcriptionist for Dennard Nip, MD.  I have reviewed the above documentation for accuracy and completeness, and I agree with the above. -  Dennard Nip, MD

## 2021-07-26 ENCOUNTER — Ambulatory Visit (INDEPENDENT_AMBULATORY_CARE_PROVIDER_SITE_OTHER): Payer: Medicare Other | Admitting: Family Medicine

## 2021-09-19 DIAGNOSIS — S92354A Nondisplaced fracture of fifth metatarsal bone, right foot, initial encounter for closed fracture: Secondary | ICD-10-CM | POA: Diagnosis not present

## 2021-09-20 ENCOUNTER — Encounter (INDEPENDENT_AMBULATORY_CARE_PROVIDER_SITE_OTHER): Payer: Self-pay

## 2021-09-20 DIAGNOSIS — S92354A Nondisplaced fracture of fifth metatarsal bone, right foot, initial encounter for closed fracture: Secondary | ICD-10-CM | POA: Diagnosis not present

## 2021-10-18 DIAGNOSIS — M79671 Pain in right foot: Secondary | ICD-10-CM | POA: Diagnosis not present

## 2021-11-15 DIAGNOSIS — M79671 Pain in right foot: Secondary | ICD-10-CM | POA: Diagnosis not present

## 2021-11-27 DIAGNOSIS — Z1231 Encounter for screening mammogram for malignant neoplasm of breast: Secondary | ICD-10-CM | POA: Diagnosis not present

## 2021-11-30 DIAGNOSIS — M85852 Other specified disorders of bone density and structure, left thigh: Secondary | ICD-10-CM | POA: Diagnosis not present

## 2021-11-30 DIAGNOSIS — Z780 Asymptomatic menopausal state: Secondary | ICD-10-CM | POA: Diagnosis not present

## 2021-11-30 DIAGNOSIS — M85851 Other specified disorders of bone density and structure, right thigh: Secondary | ICD-10-CM | POA: Diagnosis not present

## 2022-02-09 ENCOUNTER — Other Ambulatory Visit: Payer: Self-pay | Admitting: Family Medicine

## 2022-02-09 ENCOUNTER — Ambulatory Visit (INDEPENDENT_AMBULATORY_CARE_PROVIDER_SITE_OTHER): Payer: Medicare Other

## 2022-02-09 DIAGNOSIS — M545 Low back pain, unspecified: Secondary | ICD-10-CM

## 2022-02-09 DIAGNOSIS — I10 Essential (primary) hypertension: Secondary | ICD-10-CM | POA: Diagnosis not present

## 2022-02-09 DIAGNOSIS — E7800 Pure hypercholesterolemia, unspecified: Secondary | ICD-10-CM | POA: Diagnosis not present

## 2022-03-20 ENCOUNTER — Encounter: Payer: Self-pay | Admitting: Nurse Practitioner

## 2022-03-20 ENCOUNTER — Ambulatory Visit (INDEPENDENT_AMBULATORY_CARE_PROVIDER_SITE_OTHER): Payer: Medicare Other | Admitting: Nurse Practitioner

## 2022-03-20 VITALS — BP 137/85 | HR 76 | Temp 97.7°F | Ht 60.0 in | Wt 170.0 lb

## 2022-03-20 DIAGNOSIS — R0602 Shortness of breath: Secondary | ICD-10-CM | POA: Diagnosis not present

## 2022-03-20 DIAGNOSIS — E785 Hyperlipidemia, unspecified: Secondary | ICD-10-CM | POA: Diagnosis not present

## 2022-03-20 DIAGNOSIS — I10 Essential (primary) hypertension: Secondary | ICD-10-CM | POA: Diagnosis not present

## 2022-03-20 DIAGNOSIS — Z6833 Body mass index (BMI) 33.0-33.9, adult: Secondary | ICD-10-CM

## 2022-03-20 DIAGNOSIS — E669 Obesity, unspecified: Secondary | ICD-10-CM | POA: Diagnosis not present

## 2022-03-20 NOTE — Progress Notes (Signed)
Office: [phone]  /  Fax: [phone]  WEIGHT SUMMARY AND BIOMETRICS  Medical Weight Loss Height: 5' (1.524 m) Weight: 170 lb (77.1 kg) Temp: 97.7 F (36.5 C) Pulse Rate: 76 BP: 137/85 SpO2: 98 % Today's Visit #: 12 Weight at Last VIsit: 165lb Weight Lost Since Last Visit: Gained 5  Body Fat %: 44.4 % Fat Mass (lbs): 75.8 lbs Muscle Mass (lbs): 90.2 lbs Total Body Water (lbs): 65.4 lbs Visceral Fat Rating : 13 RMR: 6834 Starting Date: 08/23/20 Starting Weight: 174lb Total Weight Loss (lbs): 4 lb (1.814 kg)    HPI  Chief Complaint: OBESITY  Debra Macdonald is here to discuss her progress with her obesity treatment plan. She is on the the Category 2 Plan and states she is following her eating plan approximately 25 % of the time. She states she is exercising 0 minutes 0 times per week.   Interval History:  Patient was seen here last on 06/28/21.  She has gained 5 lbs since her last visit.  She fell and broke her foot June 2023 and was in a cast for 9 weeks and after that she fell and broke her T12 and L2 and was on bedrest for 6 weeks.  She cares for her husband who is disabled.  She hasn't been able to follow her plan as closely as she would like. She is eating 1 meal and 1 snack daily.  Sometimes she does stress eat.  She drinks decaf unsweetened tea, unsweetened lemonade, water and zero cal drinks. Patient reports having labs obtained with her PCP last month.     Pharmacotherapy: Not currently on weight loss medications  Past medications:  Never been on weight loss medications   PHYSICAL EXAM:  Blood pressure 137/85, pulse 76, temperature 97.7 F (36.5 C), height 5' (1.524 m), weight 170 lb (77.1 kg), SpO2 98 %. Body mass index is 33.2 kg/m.  General: She is overweight, cooperative, alert, well developed, and in no acute distress. PSYCH: Has normal mood, affect and thought process.   Extremities: No edema.  Neurologic: No gross sensory or motor deficits. No tremors  or fasciculations noted.    DIAGNOSTIC DATA REVIEWED:  BMET    Component Value Date/Time   NA 139 01/16/2021 1219   K 4.5 01/16/2021 1219   CL 98 01/16/2021 1219   CO2 25 01/16/2021 1219   GLUCOSE 77 01/16/2021 1219   GLUCOSE 103 (H) 08/28/2011 0241   BUN 11 01/16/2021 1219   CREATININE 0.96 01/16/2021 1219   CALCIUM 9.9 01/16/2021 1219   GFRNONAA >60 10/04/2010 1511   GFRAA >60 10/04/2010 1511   Lab Results  Component Value Date   HGBA1C 5.6 01/16/2021   HGBA1C 5.6 08/23/2020   Lab Results  Component Value Date   INSULIN 8.4 01/16/2021   INSULIN 18.0 08/23/2020   Lab Results  Component Value Date   TSH 3.310 08/23/2020   CBC    Component Value Date/Time   WBC 8.0 08/23/2020 0946   WBC 7.3 08/28/2011 0230   RBC 4.92 08/23/2020 0946   RBC 4.63 08/28/2011 0230   HGB 14.4 08/23/2020 0946   HCT 45.3 08/23/2020 0946   PLT 176 08/23/2020 0946   MCV 92 08/23/2020 0946   MCH 29.3 08/23/2020 0946   MCH 30.7 08/28/2011 0230   MCHC 31.8 08/23/2020 0946   MCHC 34.9 08/28/2011 0230   RDW 11.9 08/23/2020 0946   Iron Studies No results found for: "IRON", "TIBC", "FERRITIN", "IRONPCTSAT" Lipid Panel  Component Value Date/Time   CHOL 184 08/23/2020 0946   TRIG 141 08/23/2020 0946   HDL 48 08/23/2020 0946   LDLCALC 111 (H) 08/23/2020 0946   Hepatic Function Panel     Component Value Date/Time   PROT 7.4 01/16/2021 1219   ALBUMIN 4.6 01/16/2021 1219   AST 17 01/16/2021 1219   ALT 12 01/16/2021 1219   ALKPHOS 85 01/16/2021 1219   BILITOT 0.7 01/16/2021 1219      Component Value Date/Time   TSH 3.310 08/23/2020 0946   Nutritional Lab Results  Component Value Date   VD25OH 60.7 01/16/2021   VD25OH 52.6 08/23/2020     ASSESSMENT AND PLAN  TREATMENT PLAN FOR OBESITY:  Recommended Dietary Goals  Dorothy is currently in the action stage of change. As such, her goal is to continue weight management plan. She has agreed to the Category 1 Plan. Did well  on Cat 1 plan in the past.  Patient is currently not eating enough protein or calories.  Multiple handouts given today.    Behavioral Intervention  We discussed the following Behavioral Modification Strategies today: increasing lean protein intake, decreasing simple carbohydrates , increasing vegetables, avoid skipping meals, and increase water intake.  Additional resources provided today: NA  Recommended Physical Activity Goals  Kamron has been advised to work up to 150 minutes of moderate intensity aerobic activity a week and strengthening exercises 2-3 times per week for cardiovascular health, weight loss maintenance and preservation of muscle mass.   She has agreed to increase physical activity in their day and reduce sedentary time (increase NEAT).  and Patient also encouraged on scheduling and tracking physical activity.  Encouraged chair exercising and walking.     ASSOCIATED CONDITIONS ADDRESSED TODAY  Essential hypertension She is taking Hyzaar 100-12.'5mg'$ .  Denies side effects. Denies chest pain, shortness of breath or palpitations.  Continue to follow up with PCP.  Continue medications as directed.   Hyperlipidemia, unspecified hyperlipidemia type She is taking Crestor '5mg'$ . Denies side effects.  Continue to follow up with PCP.  Continue medications as directed.   Shortness of breath on exertion IC done today.  Last IC was 1311, today IC was 1267.  Worsening.  Discussed the importance of increasing protein intake, water intake and exercise/movement.    Generalized obesity  BMI 33.0-33.9,adult    Will bring labs in next visit to review.   Return in about 4 weeks (around 04/17/2022).Marland Kitchen She was informed of the importance of frequent follow up visits to maximize her success with intensive lifestyle modifications for her multiple health conditions.   ATTESTASTION STATEMENTS:  Reviewed by clinician on day of visit: allergies, medications, problem list, medical history, surgical  history, family history, social history, and previous encounter notes.   Time spent on visit including pre-visit chart review and post-visit care and charting was 30 minutes.    Ailene Rud. Elynor Kallenberger FNP-C

## 2022-04-06 DIAGNOSIS — R109 Unspecified abdominal pain: Secondary | ICD-10-CM | POA: Diagnosis not present

## 2022-04-17 ENCOUNTER — Encounter: Payer: Self-pay | Admitting: Nurse Practitioner

## 2022-04-17 ENCOUNTER — Ambulatory Visit (INDEPENDENT_AMBULATORY_CARE_PROVIDER_SITE_OTHER): Payer: Medicare Other | Admitting: Nurse Practitioner

## 2022-04-17 VITALS — BP 138/83 | HR 72 | Temp 98.3°F | Ht 60.0 in | Wt 169.0 lb

## 2022-04-17 DIAGNOSIS — Z6833 Body mass index (BMI) 33.0-33.9, adult: Secondary | ICD-10-CM | POA: Diagnosis not present

## 2022-04-17 DIAGNOSIS — I10 Essential (primary) hypertension: Secondary | ICD-10-CM

## 2022-04-17 DIAGNOSIS — E669 Obesity, unspecified: Secondary | ICD-10-CM | POA: Diagnosis not present

## 2022-04-17 NOTE — Progress Notes (Signed)
Office: [phone]  /  Fax: [phone]  WEIGHT SUMMARY AND BIOMETRICS  Weight Lost Since Last Visit: 1lb  No data recorded  Vitals Temp: 98.3 F (36.8 C) BP: 138/83 Pulse Rate: 72 SpO2: 97 %   Anthropometric Measurements Height: 5' (1.524 m) Weight: 169 lb (76.7 kg) BMI (Calculated): 33.01 Weight at Last Visit: 170lb Weight Lost Since Last Visit: 1lb Starting Weight: 174lb Total Weight Loss (lbs): 5 lb (2.268 kg)   Body Composition  Body Fat %: 44.2 % Fat Mass (lbs): 74.8 lbs Muscle Mass (lbs): 89.6 lbs Total Body Water (lbs): 66.6 lbs Visceral Fat Rating : 13   Other Clinical Data Today's Visit #: 13 Starting Date: 08/23/20     HPI  Chief Complaint: OBESITY  Debra Macdonald is here to discuss her progress with her obesity treatment plan. She is on the Cat 1 plan and states she is following her eating plan approximately 80 % of the time. She states she is exercising 30 minutes 4-5 days per week.   Interval History:  Since last office visit she has lost 1 pound.  Since her last visit she had a wisdom tooth removed.  She had a reaction to her antibiotic and a reaction to steroid injection.  She had to eat soft foods and wasn't able to follow the plan after having her tooth extracted.  She started back on a regular diet one week ago.  She is sometimes substituting a protein shake for a meal replacement.  She is drinking water, coffee rarely, tea and zero calorie soda.  Her husband's 75th birthday is today and they are planning to celebrate tonight.    Pharmacotherapy for weight loss: She is not currently taking medications  for medical weight loss.   Previous pharmacotherapy for medical weight loss:   Never been on weight loss medications   Bariatric surgery:  Never had bariatric surgery  Hypertension Hypertension well controlled.  Medication(s): Hyzaar 100-12.5 mg Denies chest pain, palpitations and SOB.  BP Readings from Last 3 Encounters:  04/17/22  138/83  03/20/22 137/85  06/28/21 111/69   Lab Results  Component Value Date   CREATININE 0.96 01/16/2021   CREATININE 0.79 08/23/2020   CREATININE 0.80 08/28/2011     PHYSICAL EXAM:  Blood pressure 138/83, pulse 72, temperature 98.3 F (36.8 C), height 5' (1.524 m), weight 169 lb (76.7 kg), SpO2 97 %. Body mass index is 33.01 kg/m.  General: She is overweight, cooperative, alert, well developed, and in no acute distress. PSYCH: Has normal mood, affect and thought process.   Extremities: No edema.  Neurologic: No gross sensory or motor deficits. No tremors or fasciculations noted.    DIAGNOSTIC DATA REVIEWED:  BMET    Component Value Date/Time   NA 139 01/16/2021 1219   K 4.5 01/16/2021 1219   CL 98 01/16/2021 1219   CO2 25 01/16/2021 1219   GLUCOSE 77 01/16/2021 1219   GLUCOSE 103 (H) 08/28/2011 0241   BUN 11 01/16/2021 1219   CREATININE 0.96 01/16/2021 1219   CALCIUM 9.9 01/16/2021 1219   GFRNONAA >60 10/04/2010 1511   GFRAA >60 10/04/2010 1511   Lab Results  Component Value Date   HGBA1C 5.6 01/16/2021   HGBA1C 5.6 08/23/2020   Lab Results  Component Value Date   INSULIN 8.4 01/16/2021   INSULIN 18.0 08/23/2020   Lab Results  Component Value Date   TSH 3.310 08/23/2020   CBC    Component Value Date/Time   WBC 8.0 08/23/2020 0946  WBC 7.3 08/28/2011 0230   RBC 4.92 08/23/2020 0946   RBC 4.63 08/28/2011 0230   HGB 14.4 08/23/2020 0946   HCT 45.3 08/23/2020 0946   PLT 176 08/23/2020 0946   MCV 92 08/23/2020 0946   MCH 29.3 08/23/2020 0946   MCH 30.7 08/28/2011 0230   MCHC 31.8 08/23/2020 0946   MCHC 34.9 08/28/2011 0230   RDW 11.9 08/23/2020 0946   Iron Studies No results found for: "IRON", "TIBC", "FERRITIN", "IRONPCTSAT" Lipid Panel     Component Value Date/Time   CHOL 184 08/23/2020 0946   TRIG 141 08/23/2020 0946   HDL 48 08/23/2020 0946   LDLCALC 111 (H) 08/23/2020 0946   Hepatic Function Panel     Component Value Date/Time    PROT 7.4 01/16/2021 1219   ALBUMIN 4.6 01/16/2021 1219   AST 17 01/16/2021 1219   ALT 12 01/16/2021 1219   ALKPHOS 85 01/16/2021 1219   BILITOT 0.7 01/16/2021 1219      Component Value Date/Time   TSH 3.310 08/23/2020 0946   Nutritional Lab Results  Component Value Date   VD25OH 60.7 01/16/2021   VD25OH 52.6 08/23/2020     ASSESSMENT AND PLAN  TREATMENT PLAN FOR OBESITY:  Recommended Dietary Goals  Aditi is currently in the action stage of change. As such, her goal is to continue weight management plan. She has agreed to the Category 1 Plan.  Behavioral Intervention  We discussed the following Behavioral Modification Strategies today: increasing lean protein intake, decreasing simple carbohydrates , increasing vegetables, avoiding skipping meals, increasing water intake, and work on meal planning and easy cooking plans.  Additional resources provided today: NA  Recommended Physical Activity Goals  Katonya has been advised to work up to 150 minutes of moderate intensity aerobic activity a week and strengthening exercises 2-3 times per week for cardiovascular health, weight loss maintenance and preservation of muscle mass.   She has agreed to continue physical activity as is.    ASSOCIATED CONDITIONS ADDRESSED TODAY  Action/Plan  Essential hypertension Continue to follow up with PCP.  Continue medications as directed  Andalasia is working on healthy weight loss and exercise to improve blood pressure control. We will watch for signs of hypotension as she continues her lifestyle modifications.   Generalized obesity  BMI 33.0-33.9,adult       Labs requested from PCP 's office  Return in about 4 weeks (around 05/15/2022) for will you please request labs from PCP's office.Marland Kitchen She was informed of the importance of frequent follow up visits to maximize her success with intensive lifestyle modifications for her multiple health conditions.   ATTESTASTION  STATEMENTS:  Reviewed by clinician on day of visit: allergies, medications, problem list, medical history, surgical history, family history, social history, and previous encounter notes.   Time spent on visit including pre-visit chart review and post-visit care and charting was 30 minutes.    Ailene Rud. Tarrin Menn FNP-C

## 2022-05-15 ENCOUNTER — Ambulatory Visit (INDEPENDENT_AMBULATORY_CARE_PROVIDER_SITE_OTHER): Payer: Medicare Other | Admitting: Nurse Practitioner

## 2022-05-15 ENCOUNTER — Encounter: Payer: Self-pay | Admitting: Nurse Practitioner

## 2022-05-15 VITALS — BP 142/83 | HR 75 | Temp 97.9°F | Ht 60.0 in | Wt 171.0 lb

## 2022-05-15 DIAGNOSIS — I10 Essential (primary) hypertension: Secondary | ICD-10-CM

## 2022-05-15 DIAGNOSIS — E669 Obesity, unspecified: Secondary | ICD-10-CM | POA: Diagnosis not present

## 2022-05-15 DIAGNOSIS — Z6833 Body mass index (BMI) 33.0-33.9, adult: Secondary | ICD-10-CM | POA: Diagnosis not present

## 2022-05-15 DIAGNOSIS — E785 Hyperlipidemia, unspecified: Secondary | ICD-10-CM

## 2022-05-15 NOTE — Progress Notes (Signed)
Office: [phone]  /  Fax: [phone]  WEIGHT SUMMARY AND BIOMETRICS  Weight Lost Since Last Visit: 0lb  Weight Gained Since Last Visit: 2lb   Vitals Temp: 97.9 F (36.6 C) BP: (!) 142/83 Pulse Rate: 75 SpO2: 96 %   Anthropometric Measurements Height: 5' (1.524 m) Weight: 171 lb (77.6 kg) BMI (Calculated): 33.4 Weight at Last Visit: 169lb Weight Lost Since Last Visit: 0lb Weight Gained Since Last Visit: 2lb Starting Weight: 174lb Total Weight Loss (lbs): 3 lb (1.361 kg)   Body Composition  Body Fat %: 44.7 % Fat Mass (lbs): 76.4 lbs Muscle Mass (lbs): 89.8 lbs Total Body Water (lbs): 66.4 lbs Visceral Fat Rating : 13   Other Clinical Data RMR: 1267 Fasting: no Labs: no Today's Visit #: 14 Starting Date: 08/23/20     HPI  Chief Complaint: OBESITY  Debra Macdonald is here to discuss her progress with her obesity treatment plan. She is on the the Category 1 Plan and states she is following her eating plan approximately 65 % of the time. She states she is exercising 30 minutes 1-2 days per week.   Interval History:  Since last office visit she gained 2 pounds.  She celebrated 4 birthdays since her last visit.  She is under more stress at home.  Her husband is declining.  She is not sleeping due to stress/can't sleep. No upcoming celebrations.  She is drinking water with flavoring.  Rarely drinks a sweet tea.     Pharmacotherapy for weight loss: She is not currently taking medications  for medical weight loss.   Previous pharmacotherapy for medical weight loss:  None  Bariatric surgery:  Never had bariatric surgery.     Hypertension Hypertension elevated today. Feels due to stress at home and not sleeping well.   Medication(s): Hyzaar 100-12.5mg .  Denies side effects.   Denies chest pain, palpitations and SOB.  BP Readings from Last 3 Encounters:  05/15/22 (!) 142/83  04/17/22 138/83  03/20/22 137/85   Lab Results  Component Value Date    CREATININE 0.96 01/16/2021   CREATININE 0.79 08/23/2020   CREATININE 0.80 08/28/2011    Hyperlipidemia Medication(s): Crestor 10mg  3 days per week. Denies side effects.   Reports having labs at PCP's office 6 months ago.   Lab Results  Component Value Date   CHOL 184 08/23/2020   HDL 48 08/23/2020   LDLCALC 111 (H) 08/23/2020   TRIG 141 08/23/2020   Lab Results  Component Value Date   ALT 12 01/16/2021   AST 17 01/16/2021   ALKPHOS 85 01/16/2021   BILITOT 0.7 01/16/2021   The 10-year ASCVD risk score (Arnett DK, et al., 2019) is: 12.5%   Values used to calculate the score:     Age: 24 years     Sex: Female     Is Non-Hispanic African American: No     Diabetic: No     Tobacco smoker: No     Systolic Blood Pressure: [id number] mmHg     Is BP treated: Yes     HDL Cholesterol: 48 mg/dL     Total Cholesterol: 184 mg/dL   PHYSICAL EXAM:  Blood pressure (!) 142/83, pulse 75, temperature 97.9 F (36.6 C), height 5' (1.524 m), weight 171 lb (77.6 kg), SpO2 96 %. Body mass index is 33.4 kg/m.  General: She is overweight, cooperative, alert, well developed, and in no acute distress. PSYCH: Has normal mood, affect and thought process.   Extremities: No edema.  Neurologic:  No gross sensory or motor deficits. No tremors or fasciculations noted.    DIAGNOSTIC DATA REVIEWED:  BMET    Component Value Date/Time   NA 139 01/16/2021 1219   K 4.5 01/16/2021 1219   CL 98 01/16/2021 1219   CO2 25 01/16/2021 1219   GLUCOSE 77 01/16/2021 1219   GLUCOSE 103 (H) 08/28/2011 0241   BUN 11 01/16/2021 1219   CREATININE 0.96 01/16/2021 1219   CALCIUM 9.9 01/16/2021 1219   GFRNONAA >60 10/04/2010 1511   GFRAA >60 10/04/2010 1511   Lab Results  Component Value Date   HGBA1C 5.6 01/16/2021   HGBA1C 5.6 08/23/2020   Lab Results  Component Value Date   INSULIN 8.4 01/16/2021   INSULIN 18.0 08/23/2020   Lab Results  Component Value Date   TSH 3.310 08/23/2020   CBC    Component  Value Date/Time   WBC 8.0 08/23/2020 0946   WBC 7.3 08/28/2011 0230   RBC 4.92 08/23/2020 0946   RBC 4.63 08/28/2011 0230   HGB 14.4 08/23/2020 0946   HCT 45.3 08/23/2020 0946   PLT 176 08/23/2020 0946   MCV 92 08/23/2020 0946   MCH 29.3 08/23/2020 0946   MCH 30.7 08/28/2011 0230   MCHC 31.8 08/23/2020 0946   MCHC 34.9 08/28/2011 0230   RDW 11.9 08/23/2020 0946   Iron Studies No results found for: "IRON", "TIBC", "FERRITIN", "IRONPCTSAT" Lipid Panel     Component Value Date/Time   CHOL 184 08/23/2020 0946   TRIG 141 08/23/2020 0946   HDL 48 08/23/2020 0946   LDLCALC 111 (H) 08/23/2020 0946   Hepatic Function Panel     Component Value Date/Time   PROT 7.4 01/16/2021 1219   ALBUMIN 4.6 01/16/2021 1219   AST 17 01/16/2021 1219   ALT 12 01/16/2021 1219   ALKPHOS 85 01/16/2021 1219   BILITOT 0.7 01/16/2021 1219      Component Value Date/Time   TSH 3.310 08/23/2020 0946   Nutritional Lab Results  Component Value Date   VD25OH 60.7 01/16/2021   VD25OH 52.6 08/23/2020     ASSESSMENT AND PLAN  TREATMENT PLAN FOR OBESITY:  Recommended Dietary Goals  Tamzyn is currently in the action stage of change. As such, her goal is to continue weight management plan. She has agreed to the Category 1 Plan.  Behavioral Intervention  We discussed the following Behavioral Modification Strategies today: increasing lean protein intake, decreasing simple carbohydrates , increasing vegetables, increasing fiber rich foods, avoiding skipping meals, increasing water intake, and work on meal planning and preparation.  Additional resources provided today: NA  Recommended Physical Activity Goals  Kameya has been advised to work up to 150 minutes of moderate intensity aerobic activity a week and strengthening exercises 2-3 times per week for cardiovascular health, weight loss maintenance and preservation of muscle mass.   She has agreed to Continue current level of physical activity ,  Think about ways to increase physical activity, and Work on scheduling and tracking physical activity.     ASSOCIATED CONDITIONS ADDRESSED TODAY  Action/Plan  Essential hypertension Continue to follow-up with PCP.  Continue medications as directed.  Hyperlipidemia, unspecified hyperlipidemia type Continue medications as directed.  Will check fasting labs at next visit.  Generalized obesity  BMI 33.0-33.9,adult      Goals: Drink more water Decrease sweets Sleep hygiene discussed To see PCP for follow up for anxiety/depression/insomnia  Will check labs at next visit.   Return in about 4 weeks (around 06/12/2022).Marland Kitchen She  was informed of the importance of frequent follow up visits to maximize her success with intensive lifestyle modifications for her multiple health conditions.   ATTESTASTION STATEMENTS:  Reviewed by clinician on day of visit: allergies, medications, problem list, medical history, surgical history, family history, social history, and previous encounter notes.   Time spent on visit including pre-visit chart review and post-visit care and charting was 30 minutes.    Ailene Rud. Xylan Sheils FNP-C

## 2022-06-18 ENCOUNTER — Ambulatory Visit: Payer: Medicare Other | Admitting: Nurse Practitioner

## 2022-07-03 ENCOUNTER — Ambulatory Visit: Payer: Medicare Other | Admitting: Nurse Practitioner

## 2022-08-15 DIAGNOSIS — Z23 Encounter for immunization: Secondary | ICD-10-CM | POA: Diagnosis not present

## 2022-08-15 DIAGNOSIS — Z1211 Encounter for screening for malignant neoplasm of colon: Secondary | ICD-10-CM | POA: Diagnosis not present

## 2022-08-15 DIAGNOSIS — I10 Essential (primary) hypertension: Secondary | ICD-10-CM | POA: Diagnosis not present

## 2022-08-15 DIAGNOSIS — Z0000 Encounter for general adult medical examination without abnormal findings: Secondary | ICD-10-CM | POA: Diagnosis not present

## 2022-08-15 DIAGNOSIS — E7800 Pure hypercholesterolemia, unspecified: Secondary | ICD-10-CM | POA: Diagnosis not present

## 2022-08-15 DIAGNOSIS — M545 Low back pain, unspecified: Secondary | ICD-10-CM | POA: Diagnosis not present

## 2022-08-15 DIAGNOSIS — Z1239 Encounter for other screening for malignant neoplasm of breast: Secondary | ICD-10-CM | POA: Diagnosis not present

## 2022-09-30 DIAGNOSIS — S91051A Open bite, right ankle, initial encounter: Secondary | ICD-10-CM | POA: Diagnosis not present

## 2022-09-30 DIAGNOSIS — T63091A Toxic effect of venom of other snake, accidental (unintentional), initial encounter: Secondary | ICD-10-CM | POA: Diagnosis not present

## 2022-09-30 DIAGNOSIS — I10 Essential (primary) hypertension: Secondary | ICD-10-CM | POA: Diagnosis not present

## 2022-09-30 DIAGNOSIS — T63001A Toxic effect of unspecified snake venom, accidental (unintentional), initial encounter: Secondary | ICD-10-CM | POA: Diagnosis not present

## 2022-11-22 ENCOUNTER — Ambulatory Visit: Payer: Medicare Other | Admitting: Nurse Practitioner

## 2022-11-27 ENCOUNTER — Ambulatory Visit (INDEPENDENT_AMBULATORY_CARE_PROVIDER_SITE_OTHER): Payer: Medicare Other | Admitting: Family Medicine

## 2022-11-27 ENCOUNTER — Encounter: Payer: Self-pay | Admitting: Family Medicine

## 2022-11-27 VITALS — BP 144/83 | HR 69 | Temp 98.2°F | Ht 60.0 in | Wt 175.0 lb

## 2022-11-27 DIAGNOSIS — I10 Essential (primary) hypertension: Secondary | ICD-10-CM

## 2022-11-27 DIAGNOSIS — Z6834 Body mass index (BMI) 34.0-34.9, adult: Secondary | ICD-10-CM | POA: Diagnosis not present

## 2022-11-27 DIAGNOSIS — E6609 Other obesity due to excess calories: Secondary | ICD-10-CM | POA: Diagnosis not present

## 2022-11-27 DIAGNOSIS — E785 Hyperlipidemia, unspecified: Secondary | ICD-10-CM | POA: Insufficient documentation

## 2022-11-27 DIAGNOSIS — Z636 Dependent relative needing care at home: Secondary | ICD-10-CM | POA: Diagnosis not present

## 2022-11-27 DIAGNOSIS — E66811 Obesity, class 1: Secondary | ICD-10-CM | POA: Insufficient documentation

## 2022-11-27 NOTE — Assessment & Plan Note (Signed)
Worsening.  She notes worsening caregiver stress as her husband's Alzheimer's disease progresses.  This has been leading to some meal skipping, poor food choices and craving sweets.  She has been trying to limit junk food snacks at home.  She has a good support system.  We discussed easy grab and go options to get in all of her meals and avoid meal skipping.  Will focus on lean protein and fiber with meals and snacks as reviewed together on after visit summary.  Continue increasing walking time to help with mental health.  Focus on 7 to 8 hours of high-quality sleep at night.

## 2022-11-27 NOTE — Patient Instructions (Addendum)
You can substitute out greek yogurt with breakfast for a Protein shake ([employer], [employer], [employer], [employer])  You can also pick a packet of oatmeal (< 8 g of sugar) You mix Fairlife milk Silk or High Protein milk Can add a fruit serving with it  You can have a snack mid morning: Choose a protein bar with 10+ g of protein and < 8 g of sugar Sugar free pudding mixed with [employer]Life milk  Get in lunch in daily  Remember to get 1/2 of veggies with dinner Try trading in veggie chips for kale chips or seaweed  Limit after dinner snacks  Aim for a good 30 min walk 5 x a week  Return for follow up with fasting labs and repeat IC next time

## 2022-11-27 NOTE — Assessment & Plan Note (Signed)
Blood pressure is elevated today.  She denies headache or chest pain. She reports good compliance on losartan/HCTZ 100/12.5 mg once daily  Continue current blood pressure medication.  Update labs next visit.

## 2022-11-27 NOTE — Progress Notes (Signed)
Office: [phone]  /  Fax: [phone]  WEIGHT SUMMARY AND BIOMETRICS  Starting Date: 08/23/20  Starting Weight: 174lb   Weight Lost Since Last Visit: 0lb   Vitals Temp: 98.2 F (36.8 C) BP: (!) 144/83 Pulse Rate: 69 SpO2: 96 %   Body Composition  Body Fat %: 45.5 % Fat Mass (lbs): 79.8 lbs Muscle Mass (lbs): 90.6 lbs Total Body Water (lbs): 68.4 lbs Visceral Fat Rating : 14     HPI  Chief Complaint: OBESITY  Debra Macdonald is here to discuss her progress with her obesity treatment plan. She is on the the Category 1 Plan and states she is following her eating plan approximately 50-60 % of the time. She states she is walking for 30 minutes for 4 times a week.    Interval History:  Since last office visit she is up 4 lb She has been going thru a lot of stress with her husband's Alzheimer's Dz She did go up to 180 lb and has gotten back on track in the past month She has a net weight gain of 1 pound in the past 2+ years of medically supervised weight management She has been over snacking due to stress and making poor food choices at times.  She is also doing some meal skipping She is back to walking 30 minutes 4 times a week and notes this does help her mental health She had been getting up in the melanite and having a snack but has been trying to stop this habit.  Pharmacotherapy: None  PHYSICAL EXAM:  Blood pressure (!) 144/83, pulse 69, temperature 98.2 F (36.8 C), height 5' (1.524 m), weight 175 lb (79.4 kg), SpO2 96%. Body mass index is 34.18 kg/m.  General: She is overweight, cooperative, alert, well developed, and in no acute distress. PSYCH: Has normal mood, affect and thought process.   Lungs: Normal breathing effort, no conversational dyspnea.   ASSESSMENT AND PLAN  TREATMENT PLAN FOR OBESITY:  Recommended Dietary Goals  Kalleigh is currently in the action stage of change. As such, her goal is to continue weight management plan. She has agreed  to the Category 1 Plan. -Reviewed swap out items on after visit summary  Behavioral Intervention  We discussed the following Behavioral Modification Strategies today: increasing lean protein intake to established goals, increasing vegetables, increasing lower glycemic fruits, increasing fiber rich foods, avoiding skipping meals, work on meal planning and preparation, keeping healthy foods at home, planning for success, better snacking choices, and continue to work on maintaining a reduced calorie state, getting the recommended amount of protein, incorporating whole foods, making healthy choices, staying well hydrated and practicing mindfulness when eating..  Additional resources provided today: NA  Recommended Physical Activity Goals  Zyona has been advised to work up to 150 minutes of moderate intensity aerobic activity a week and strengthening exercises 2-3 times per week for cardiovascular health, weight loss maintenance and preservation of muscle mass.   She has agreed to Start aerobic activity with a goal of 150 minutes a week at moderate intensity.   Pharmacotherapy changes for the treatment of obesity: None  ASSOCIATED CONDITIONS ADDRESSED TODAY  Essential hypertension Assessment & Plan: Blood pressure is elevated today.  She denies headache or chest pain. She reports good compliance on losartan/HCTZ 100/12.5 mg once daily  Continue current blood pressure medication.  Update labs next visit.   Class 1 obesity due to excess calories without serious comorbidity with body mass index (BMI) of 34.0 to 34.9 in  adult Assessment & Plan: Reviewed patient's overall progress with our program over 2 years of medically supervised weight management with 1 pound of weight gain.  She has been frustrated.  Barriers to progress include caregiver stress, lack of time, emotional eating.  We discussed a plan to allow more variety with breakfast, reviewed the specifically on her after visit  summary.  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  Will focus on easy grab and go fresh fruit and adding one half plate of nonstarchy vegetables to dinner.  She will continue to work on stress reduction and we agrees to a target goal of 30 minutes of walking 5 days a week which has been starting to help her mental health.  Will update fasting labs and IC next visit   Caregiver stress Assessment & Plan: Worsening.  She notes worsening caregiver stress as her husband's Alzheimer's disease progresses.  This has been leading to some meal skipping, poor food choices and craving sweets.  She has been trying to limit junk food snacks at home.  She has a good support system.  We discussed easy grab and go options to get in all of her meals and avoid meal skipping.  Will focus on lean protein and fiber with meals and snacks as reviewed together on after visit summary.  Continue increasing walking time to help with mental health.  Focus on 7 to 8 hours of high-quality sleep at night.       She was informed of the importance of frequent follow up visits to maximize her success with intensive lifestyle modifications for her multiple health conditions.   ATTESTASTION STATEMENTS:  Reviewed by clinician on day of visit: allergies, medications, problem list, medical history, surgical history, family history, social history, and previous encounter notes pertinent to obesity diagnosis.   I have personally spent 30 minutes total time today in preparation, patient care, nutritional counseling and documentation for this visit, including the following: review of clinical lab tests; review of medical tests/procedures/services.      Debra Brink, DO DABFM, DABOM [facility] [address] [place], [place] [address] [phone]

## 2022-11-27 NOTE — Assessment & Plan Note (Signed)
Reviewed patient's overall progress with our program over 2 years of medically supervised weight management with 1 pound of weight gain.  She has been frustrated.  Barriers to progress include caregiver stress, lack of time, emotional eating.  We discussed a plan to allow more variety with breakfast, reviewed the specifically on her after visit summary.  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  Will focus on easy grab and go fresh fruit and adding one half plate of nonstarchy vegetables to dinner.  She will continue to work on stress reduction and we agrees to a target goal of 30 minutes of walking 5 days a week which has been starting to help her mental health.  Will update fasting labs and IC next visit

## 2022-12-27 ENCOUNTER — Ambulatory Visit: Payer: Medicare Other | Admitting: Family Medicine

## 2023-01-23 DIAGNOSIS — I10 Essential (primary) hypertension: Secondary | ICD-10-CM | POA: Diagnosis not present

## 2023-01-23 DIAGNOSIS — E7800 Pure hypercholesterolemia, unspecified: Secondary | ICD-10-CM | POA: Diagnosis not present

## 2023-01-23 DIAGNOSIS — R350 Frequency of micturition: Secondary | ICD-10-CM | POA: Diagnosis not present

## 2023-01-24 ENCOUNTER — Encounter: Payer: Self-pay | Admitting: Family Medicine

## 2023-01-24 ENCOUNTER — Ambulatory Visit: Payer: Medicare Other | Admitting: Family Medicine

## 2023-01-24 VITALS — BP 114/77 | HR 72 | Temp 97.6°F | Ht 60.0 in | Wt 170.0 lb

## 2023-01-24 DIAGNOSIS — I10 Essential (primary) hypertension: Secondary | ICD-10-CM

## 2023-01-24 DIAGNOSIS — Z636 Dependent relative needing care at home: Secondary | ICD-10-CM

## 2023-01-24 DIAGNOSIS — Z6833 Body mass index (BMI) 33.0-33.9, adult: Secondary | ICD-10-CM

## 2023-01-24 DIAGNOSIS — E7849 Other hyperlipidemia: Secondary | ICD-10-CM | POA: Diagnosis not present

## 2023-01-24 DIAGNOSIS — E66811 Obesity, class 1: Secondary | ICD-10-CM

## 2023-01-24 MED ORDER — LOSARTAN POTASSIUM-HCTZ 100-12.5 MG PO TABS: 1.0000 | ORAL_TABLET | Freq: Every day | ORAL | 0 refills | Status: AC

## 2023-01-24 MED ORDER — ROSUVASTATIN CALCIUM 5 MG PO TABS: ORAL_TABLET | ORAL | Status: AC

## 2023-01-24 NOTE — Assessment & Plan Note (Signed)
BP well controlled Reviewed PCP notes from yesterday and confirmed her taking Losartan / hydrochlorothiazide 100/12.5 mg once daily She denies LE edema, CP or HA  Continue current meds per PCP

## 2023-01-24 NOTE — Assessment & Plan Note (Signed)
FLP at goal, reviewed from PCP visit yesterday. She takes rosuvastatin 5 mg every other day at bedtime without adverse SE She is doing better with healthy food choices and physical activity

## 2023-01-24 NOTE — Assessment & Plan Note (Signed)
Worsening since the death of her neighbor for whom she was caring for. She has struggled more with sleep lately Stress with husband's health is unchanged  Offered the addition of CBT with Dr Dewaine Conger She will think about it

## 2023-01-24 NOTE — Progress Notes (Signed)
Office: [phone]  /  Fax: [phone]  WEIGHT SUMMARY AND BIOMETRICS  Starting Date: 08/23/20  Starting Weight: 174lb   Weight Lost Since Last Visit: 5lb   Vitals Temp: 97.6 F (36.4 C) BP: 114/77 Pulse Rate: 72 SpO2: 96 %   Body Composition  Body Fat %: 42.9 % Fat Mass (lbs): 73.2 lbs Muscle Mass (lbs): 92.4 lbs Total Body Water (lbs): 67 lbs Visceral Fat Rating : 13   HPI  Chief Complaint: OBESITY  Debra Macdonald is here to discuss her progress with her obesity treatment plan. She is on the the Category 1 Plan and states she is following her eating plan approximately 65-70 % of the time. She states she is walking for 30 minutes 2 times per week.  Interval History:  Since last office visit she is down 5 lb She is up 1.8 lb of muscle mass and is down 6.6 lb of body fat since her last visit She has not craved sweets She has had to physically care for her husband- lifting, pushing, pulling She is avoiding SSBs She found her neighbor dead recently and this has been bothering her She hasn't been sleeping as well lately Her net weight loss if 5 lb in the past 2.5 years She denies emotional eating She plans to increase her walking time but has been limited due to cold weather She is doing some yardwork and household chores  Pharmacotherapy: none   PHYSICAL EXAM:  Blood pressure 114/77, pulse 72, temperature 97.6 F (36.4 C), height 5' (1.524 m), weight 170 lb (77.1 kg), SpO2 96%. Body mass index is 33.2 kg/m.  General: She is overweight, cooperative, alert, well developed, and in no acute distress. PSYCH: Has normal mood, affect and thought process.   Lungs: Normal breathing effort, no conversational dyspnea.   ASSESSMENT AND PLAN  TREATMENT PLAN FOR OBESITY:  Recommended Dietary Goals  Trenecia is currently in the action stage of change. As such, her goal is to continue weight management plan. She has agreed to practicing portion control and making  smarter food choices, such as increasing vegetables and decreasing simple carbohydrates.  Behavioral Intervention  We discussed the following Behavioral Modification Strategies today: increasing fiber rich foods, increasing water intake , work on meal planning and preparation, keeping healthy foods at home, practice mindfulness eating and understand the difference between hunger signals and cravings, work on managing stress, creating time for self-care and relaxation, avoiding temptations and identifying enticing environmental cues, planning for success, and continue to work on maintaining a reduced calorie state, getting the recommended amount of protein, incorporating whole foods, making healthy choices, staying well hydrated and practicing mindfulness when eating..  Additional resources provided today: NA  Recommended Physical Activity Goals  Fatina has been advised to work up to 150 minutes of moderate intensity aerobic activity a week and strengthening exercises 2-3 times per week for cardiovascular health, weight loss maintenance and preservation of muscle mass.   She has agreed to Think about enjoyable ways to increase daily physical activity and overcoming barriers to exercise and Increase physical activity in their day and reduce sedentary time (increase NEAT). Plan to increase walking time   Pharmacotherapy changes for the treatment of obesity: none  ASSOCIATED CONDITIONS ADDRESSED TODAY  Essential hypertension Assessment & Plan: BP well controlled Reviewed PCP notes from yesterday and confirmed her taking Losartan / hydrochlorothiazide 100/12.5 mg once daily She denies LE edema, CP or HA  Continue current meds per PCP   Orders: -  Losartan Potassium-HCTZ; Take 1 tablet by mouth daily.  Dispense: 30 tablet; Refill: 0  Other hyperlipidemia Assessment & Plan: FLP at goal, reviewed from PCP visit yesterday. She takes rosuvastatin 5 mg every other day at bedtime without  adverse SE She is doing better with healthy food choices and physical activity    Orders: -     Rosuvastatin Calcium; 1 po every M, W, F night b-4 bed  Class 1 obesity due to excess calories with body mass index (BMI) of 33.0 to 33.9 in adult, unspecified whether serious comorbidity present  Caregiver stress Assessment & Plan: Worsening since the death of her neighbor for whom she was caring for. She has struggled more with sleep lately Stress with husband's health is unchanged  Offered the addition of CBT with Dr Dewaine Conger She will think about it        She was informed of the importance of frequent follow up visits to maximize her success with intensive lifestyle modifications for her multiple health conditions.   ATTESTASTION STATEMENTS:  Reviewed by clinician on day of visit: allergies, medications, problem list, medical history, surgical history, family history, social history, and previous encounter notes pertinent to obesity diagnosis.   I have personally spent 30 minutes total time today in preparation, patient care, nutritional counseling and documentation for this visit, including the following: review of clinical lab tests; review of medical tests/procedures/services.      Glennis Brink, DO DABFM, DABOM [facility] [address] [place], [place] [address] [phone]

## 2023-03-04 ENCOUNTER — Ambulatory Visit: Payer: Medicare Other | Admitting: Nurse Practitioner

## 2023-03-11 DIAGNOSIS — M654 Radial styloid tenosynovitis [de Quervain]: Secondary | ICD-10-CM | POA: Diagnosis not present

## 2023-04-17 DIAGNOSIS — R1012 Left upper quadrant pain: Secondary | ICD-10-CM | POA: Diagnosis not present

## 2023-04-17 DIAGNOSIS — R9431 Abnormal electrocardiogram [ECG] [EKG]: Secondary | ICD-10-CM | POA: Diagnosis not present

## 2023-04-17 DIAGNOSIS — R002 Palpitations: Secondary | ICD-10-CM | POA: Diagnosis not present

## 2023-04-17 DIAGNOSIS — K529 Noninfective gastroenteritis and colitis, unspecified: Secondary | ICD-10-CM | POA: Diagnosis not present

## 2023-04-17 DIAGNOSIS — R000 Tachycardia, unspecified: Secondary | ICD-10-CM | POA: Diagnosis not present

## 2023-04-17 DIAGNOSIS — R1011 Right upper quadrant pain: Secondary | ICD-10-CM | POA: Diagnosis not present

## 2023-04-17 DIAGNOSIS — R112 Nausea with vomiting, unspecified: Secondary | ICD-10-CM | POA: Diagnosis not present

## 2023-04-18 DIAGNOSIS — R112 Nausea with vomiting, unspecified: Secondary | ICD-10-CM | POA: Diagnosis not present

## 2023-05-17 DIAGNOSIS — R300 Dysuria: Secondary | ICD-10-CM | POA: Diagnosis not present

## 2023-05-24 DIAGNOSIS — R350 Frequency of micturition: Secondary | ICD-10-CM | POA: Diagnosis not present

## 2023-06-12 ENCOUNTER — Ambulatory Visit
Admission: EM | Admit: 2023-06-12 | Discharge: 2023-06-12 | Disposition: A | Discharge status: Home / Self Care | Attending: Family Medicine | Admitting: Family Medicine

## 2023-06-12 DIAGNOSIS — N3001 Acute cystitis with hematuria: Secondary | ICD-10-CM | POA: Insufficient documentation

## 2023-06-12 LAB — POCT URINALYSIS DIP (MANUAL ENTRY)
Bilirubin, UA: NEGATIVE
Glucose, UA: NEGATIVE mg/dL
Ketones, POC UA: NEGATIVE mg/dL
Leukocytes, UA: NEGATIVE
Nitrite, UA: NEGATIVE
Protein Ur, POC: NEGATIVE mg/dL
Spec Grav, UA: 1.02 (ref 1.010–1.025)
Urobilinogen, UA: 0.2 U/dL
pH, UA: 6 (ref 5.0–8.0)

## 2023-06-12 MED ORDER — NITROFURANTOIN MONOHYD MACRO 100 MG PO CAPS: 100.0000 mg | ORAL_CAPSULE | Freq: Two times a day (BID) | ORAL | 0 refills | Status: AC

## 2023-06-12 NOTE — Discharge Instructions (Signed)
 Drink lots of water Take the antibiotic twice a day as directed  Your urine has been sent to the laboratory for culture.  You will be called if any change in antibiotics is necessary

## 2023-06-12 NOTE — ED Provider Notes (Signed)
 [facility]    CSN: [id number] Arrival date & time: 06/12/23  6578      History   Chief Complaint Chief Complaint  Patient presents with   Urinary Frequency    HPI Debra Macdonald is a 68 y.o. female.   HPI  [age] year old woman who is here with urinary frequency.  On urinalysis today has hematuria.  Has a history of urinary tract infections.  She states for the last couple of days that she has been going frequently and "I was up all night".  No pain with urination.  No suprapubic pressure.  No flank pain nausea or vomiting, no fever  Past Medical History:  Diagnosis Date   Back pain    Depression    Hypertension    Hypothyroid    Joint pain    Panic attacks     Patient Active Problem List   Diagnosis Date Noted   Class 1 obesity due to excess calories without serious comorbidity with body mass index (BMI) of 34.0 to 34.9 in adult 11/27/2022   Caregiver stress 11/27/2022   Hyperlipidemia 11/27/2022   Depression 06/28/2021   Generalized obesity 06/28/2021   Essential hypertension 08/23/2020   GAD (generalized anxiety disorder)- mild Emotional eating 08/23/2020    Past Surgical History:  Procedure Laterality Date   BREAST BIOPSY Left 2002   us  core   BREAST BIOPSY Left 1985   us  core   BREAST CYST EXCISION Left unsure   BREAST EXCISIONAL BIOPSY Left 2002   BREAST EXCISIONAL BIOPSY Left 1974   CESAREAN SECTION     CHOLECYSTECTOMY     lumpectomy left breast x 2       OB History     Gravida  2   Para  2   Term  2   Preterm      AB      Living         SAB      IAB      Ectopic      Multiple      Live Births               Home Medications    Prior to Admission medications   Medication Sig Start Date End Date Taking? Authorizing Provider  nitrofurantoin, macrocrystal-monohydrate, (MACROBID) 100 MG capsule Take 1 capsule (100 mg total) by mouth 2 (two) times daily. 06/12/23  Yes Stephany Ehrich, MD  ALPRAZolam  (XANAX) 0.25 MG tablet Take 0.25 mg by mouth at bedtime as needed. For anxiety     [provider]  aspirin 81 MG tablet Take 81 mg by mouth once.    [provider]  buPROPion (WELLBUTRIN XL) 300 MG 24 hr tablet Take 300 mg by mouth daily. 05/27/21   [provider]  fish oil-omega-3 fatty acids 1000 MG capsule Take 2 g by mouth daily.      [provider]  losartan -hydrochlorothiazide (HYZAAR) 100-12.5 MG tablet Take 1 tablet by mouth daily. 01/24/23   Bowen, Leeta Puls, DO  Multiple Vitamin (MULTIVITAMIN WITH MINERALS) TABS Take 1 tablet by mouth daily.    [provider]  rosuvastatin  (CRESTOR ) 5 MG tablet 1 po every M, W, F night b-4 bed 01/24/23   Bowen, Leeta Puls, DO    Family History Family History  Problem Relation Age of Onset   Obesity Mother    Hypertension Mother    Diabetes Mother    Stroke Mother    Heart  attack Mother    Hypertension Father    Stroke Father     Social History Social History   Tobacco Use   Smoking status: Never   Smokeless tobacco: Never  Substance Use Topics   Alcohol use: No   Drug use: No     Allergies   Depo-medrol [methylprednisolone]   Review of Systems Review of Systems See HPI  Physical Exam Triage Vital Signs ED Triage Vitals [06/12/23 0941]  Encounter Vitals Group     BP (!) 154/80     Systolic BP Percentile      Diastolic BP Percentile      Pulse Rate 85     Resp 17     Temp 98.1 F (36.7 C)     Temp Source Oral     SpO2 98 %     Weight      Height      Head Circumference      Peak Flow      Pain Score 1     Pain Loc      Pain Education      Exclude from Growth Chart    No data found.  Updated Vital Signs BP (!) 154/80 (BP Location: Right Arm)   Pulse 85   Temp 98.1 F (36.7 C) (Oral)   Resp 17   SpO2 98%      Physical Exam Constitutional:      General: She is not in acute distress.    Appearance: She is well-developed.  HENT:     Head: Normocephalic and  atraumatic.  Eyes:     Conjunctiva/sclera: Conjunctivae normal.     Pupils: Pupils are equal, round, and reactive to light.  Cardiovascular:     Rate and Rhythm: Normal rate.  Pulmonary:     Effort: Pulmonary effort is normal. No respiratory distress.  Abdominal:     General: There is no distension.  Musculoskeletal:        General: Normal range of motion.     Cervical back: Normal range of motion.  Skin:    General: Skin is warm and dry.  Neurological:     Mental Status: She is alert.      UC Treatments / Results  Labs (all labs ordered are listed, but only abnormal results are displayed) Labs Reviewed  POCT URINALYSIS DIP (MANUAL ENTRY) - Abnormal; Notable for the following components:      Result Value   Blood, UA trace-intact (*)    All other components within normal limits  URINE CULTURE    EKG   Radiology No results found.  Procedures Procedures (including critical care time)  Medications Ordered in UC Medications - No data to display  Initial Impression / Assessment and Plan / UC Course  I have reviewed the triage vital signs and the nursing notes.  Pertinent labs & imaging results that were available during my care of the patient were reviewed by me and considered in my medical decision making (see chart for details).     Discussed possible urinary tract infection.  Trace blood in urine.  Will send for culture.  Cover with antibiotics.  Recommend PCP follow-up Final Clinical Impressions(s) / UC Diagnoses   Final diagnoses:  Hematuria due to acute cystitis     Discharge Instructions      Drink lots of water Take the antibiotic twice a day as directed  Your urine has been sent to the laboratory for culture.  You will be called if  any change in antibiotics is necessary   ED Prescriptions     Medication Sig Dispense Auth. Provider   nitrofurantoin, macrocrystal-monohydrate, (MACROBID) 100 MG capsule Take 1 capsule (100 mg total) by mouth 2  (two) times daily. 10 capsule Stephany Ehrich, MD      PDMP not reviewed this encounter.   Stephany Ehrich, MD 06/12/23 1009

## 2023-06-12 NOTE — ED Triage Notes (Signed)
 Pt c/o urinary frequency since Mon. Was seen previously but UA was neg.

## 2023-06-13 LAB — URINE CULTURE
Culture: NO GROWTH
Special Requests: NORMAL

## 2023-07-05 DIAGNOSIS — Z1231 Encounter for screening mammogram for malignant neoplasm of breast: Secondary | ICD-10-CM | POA: Diagnosis not present

## 2023-07-24 DIAGNOSIS — I10 Essential (primary) hypertension: Secondary | ICD-10-CM | POA: Diagnosis not present

## 2023-07-25 DIAGNOSIS — I10 Essential (primary) hypertension: Secondary | ICD-10-CM | POA: Diagnosis not present

## 2023-08-05 DIAGNOSIS — R0981 Nasal congestion: Secondary | ICD-10-CM | POA: Diagnosis not present

## 2023-08-05 DIAGNOSIS — W57XXXA Bitten or stung by nonvenomous insect and other nonvenomous arthropods, initial encounter: Secondary | ICD-10-CM | POA: Diagnosis not present

## 2023-08-05 DIAGNOSIS — Z03818 Encounter for observation for suspected exposure to other biological agents ruled out: Secondary | ICD-10-CM | POA: Diagnosis not present

## 2023-08-05 DIAGNOSIS — J029 Acute pharyngitis, unspecified: Secondary | ICD-10-CM | POA: Diagnosis not present

## 2023-08-05 DIAGNOSIS — R051 Acute cough: Secondary | ICD-10-CM | POA: Diagnosis not present

## 2023-08-05 DIAGNOSIS — R509 Fever, unspecified: Secondary | ICD-10-CM | POA: Diagnosis not present

## 2023-08-08 DIAGNOSIS — Z7982 Long term (current) use of aspirin: Secondary | ICD-10-CM | POA: Diagnosis not present

## 2023-08-08 DIAGNOSIS — E876 Hypokalemia: Secondary | ICD-10-CM | POA: Diagnosis not present

## 2023-08-08 DIAGNOSIS — Z79899 Other long term (current) drug therapy: Secondary | ICD-10-CM | POA: Diagnosis not present

## 2023-08-08 DIAGNOSIS — R29818 Other symptoms and signs involving the nervous system: Secondary | ICD-10-CM | POA: Diagnosis not present

## 2023-08-08 DIAGNOSIS — E875 Hyperkalemia: Secondary | ICD-10-CM | POA: Diagnosis not present

## 2023-08-08 DIAGNOSIS — E785 Hyperlipidemia, unspecified: Secondary | ICD-10-CM | POA: Diagnosis not present

## 2023-08-08 DIAGNOSIS — R202 Paresthesia of skin: Secondary | ICD-10-CM | POA: Diagnosis not present

## 2023-08-08 DIAGNOSIS — I10 Essential (primary) hypertension: Secondary | ICD-10-CM | POA: Diagnosis not present

## 2023-08-08 DIAGNOSIS — Z823 Family history of stroke: Secondary | ICD-10-CM | POA: Diagnosis not present

## 2023-08-08 DIAGNOSIS — R2990 Unspecified symptoms and signs involving the nervous system: Secondary | ICD-10-CM | POA: Diagnosis not present

## 2023-08-08 DIAGNOSIS — R200 Anesthesia of skin: Secondary | ICD-10-CM | POA: Diagnosis not present

## 2023-08-08 DIAGNOSIS — I639 Cerebral infarction, unspecified: Secondary | ICD-10-CM | POA: Diagnosis not present

## 2023-08-08 DIAGNOSIS — E871 Hypo-osmolality and hyponatremia: Secondary | ICD-10-CM | POA: Diagnosis not present

## 2023-08-08 DIAGNOSIS — R519 Headache, unspecified: Secondary | ICD-10-CM | POA: Diagnosis not present

## 2023-08-08 DIAGNOSIS — Z8673 Personal history of transient ischemic attack (TIA), and cerebral infarction without residual deficits: Secondary | ICD-10-CM | POA: Diagnosis not present

## 2023-08-08 DIAGNOSIS — J069 Acute upper respiratory infection, unspecified: Secondary | ICD-10-CM | POA: Diagnosis not present

## 2023-08-09 DIAGNOSIS — R2990 Unspecified symptoms and signs involving the nervous system: Secondary | ICD-10-CM | POA: Diagnosis not present

## 2023-08-12 DIAGNOSIS — I10 Essential (primary) hypertension: Secondary | ICD-10-CM | POA: Diagnosis not present

## 2023-08-12 DIAGNOSIS — E7800 Pure hypercholesterolemia, unspecified: Secondary | ICD-10-CM | POA: Diagnosis not present

## 2023-08-21 DIAGNOSIS — I10 Essential (primary) hypertension: Secondary | ICD-10-CM | POA: Diagnosis not present

## 2023-08-21 DIAGNOSIS — R9389 Abnormal findings on diagnostic imaging of other specified body structures: Secondary | ICD-10-CM | POA: Diagnosis not present

## 2023-08-21 DIAGNOSIS — Z1211 Encounter for screening for malignant neoplasm of colon: Secondary | ICD-10-CM | POA: Diagnosis not present

## 2023-08-21 DIAGNOSIS — Z1231 Encounter for screening mammogram for malignant neoplasm of breast: Secondary | ICD-10-CM | POA: Diagnosis not present

## 2023-08-21 DIAGNOSIS — Z0000 Encounter for general adult medical examination without abnormal findings: Secondary | ICD-10-CM | POA: Diagnosis not present

## 2023-09-06 DIAGNOSIS — H9312 Tinnitus, left ear: Secondary | ICD-10-CM | POA: Diagnosis not present

## 2023-09-06 DIAGNOSIS — H938X2 Other specified disorders of left ear: Secondary | ICD-10-CM | POA: Diagnosis not present

## 2023-09-12 DIAGNOSIS — E7800 Pure hypercholesterolemia, unspecified: Secondary | ICD-10-CM | POA: Diagnosis not present

## 2023-09-12 DIAGNOSIS — I10 Essential (primary) hypertension: Secondary | ICD-10-CM | POA: Diagnosis not present

## 2023-09-18 DIAGNOSIS — Q2730 Arteriovenous malformation, site unspecified: Secondary | ICD-10-CM | POA: Diagnosis not present

## 2023-09-20 ENCOUNTER — Other Ambulatory Visit: Payer: Self-pay | Admitting: Neurosurgery

## 2023-09-20 DIAGNOSIS — Q2730 Arteriovenous malformation, site unspecified: Secondary | ICD-10-CM

## 2023-10-01 ENCOUNTER — Inpatient Hospital Stay (HOSPITAL_COMMUNITY): Admission: RE | Admit: 2023-10-01 | Source: Ambulatory Visit

## 2023-10-09 DIAGNOSIS — Q2730 Arteriovenous malformation, site unspecified: Secondary | ICD-10-CM | POA: Diagnosis not present

## 2023-10-13 DIAGNOSIS — E7800 Pure hypercholesterolemia, unspecified: Secondary | ICD-10-CM | POA: Diagnosis not present

## 2023-10-13 DIAGNOSIS — I10 Essential (primary) hypertension: Secondary | ICD-10-CM | POA: Diagnosis not present

## 2023-11-12 DIAGNOSIS — I10 Essential (primary) hypertension: Secondary | ICD-10-CM | POA: Diagnosis not present

## 2023-11-12 DIAGNOSIS — E7800 Pure hypercholesterolemia, unspecified: Secondary | ICD-10-CM | POA: Diagnosis not present
